# Patient Record
Sex: Male | Born: 1950 | Race: White | Hispanic: No | Marital: Married | Smoking: Former smoker
Health system: Southern US, Community
[De-identification: ages and names within clinical notes are randomized; demographics above are authoritative.]

## PROBLEM LIST (undated history)

## (undated) DIAGNOSIS — Z992 Dependence on renal dialysis: Secondary | ICD-10-CM

## (undated) DIAGNOSIS — N289 Disorder of kidney and ureter, unspecified: Secondary | ICD-10-CM

## (undated) DIAGNOSIS — D638 Anemia in other chronic diseases classified elsewhere: Secondary | ICD-10-CM

## (undated) DIAGNOSIS — D696 Thrombocytopenia, unspecified: Secondary | ICD-10-CM

## (undated) DIAGNOSIS — J9611 Chronic respiratory failure with hypoxia: Secondary | ICD-10-CM

## (undated) DIAGNOSIS — J449 Chronic obstructive pulmonary disease, unspecified: Secondary | ICD-10-CM

## (undated) DIAGNOSIS — E119 Type 2 diabetes mellitus without complications: Secondary | ICD-10-CM

## (undated) DIAGNOSIS — D649 Anemia, unspecified: Secondary | ICD-10-CM

## (undated) DIAGNOSIS — E213 Hyperparathyroidism, unspecified: Secondary | ICD-10-CM

## (undated) DIAGNOSIS — N186 End stage renal disease: Secondary | ICD-10-CM

## (undated) HISTORY — PX: CATARACT EXTRACTION W/ INTRAOCULAR LENS  IMPLANT, BILATERAL: SHX1307

## (undated) HISTORY — PX: BELOW KNEE LEG AMPUTATION: SUR23

## (undated) HISTORY — PX: CHOLECYSTECTOMY: SHX55

---

## 2015-07-25 ENCOUNTER — Encounter: Payer: Self-pay | Admitting: Emergency Medicine

## 2015-07-25 ENCOUNTER — Emergency Department
Admission: EM | Admit: 2015-07-25 | Discharge: 2015-07-25 | Disposition: A | Discharge status: Home / Self Care | Payer: Medicare Other | Attending: Emergency Medicine | Admitting: Emergency Medicine

## 2015-07-25 DIAGNOSIS — N186 End stage renal disease: Secondary | ICD-10-CM | POA: Insufficient documentation

## 2015-07-25 DIAGNOSIS — Z992 Dependence on renal dialysis: Secondary | ICD-10-CM | POA: Diagnosis not present

## 2015-07-25 DIAGNOSIS — Y838 Other surgical procedures as the cause of abnormal reaction of the patient, or of later complication, without mention of misadventure at the time of the procedure: Secondary | ICD-10-CM | POA: Insufficient documentation

## 2015-07-25 DIAGNOSIS — I97618 Postprocedural hemorrhage and hematoma of a circulatory system organ or structure following other circulatory system procedure: Secondary | ICD-10-CM | POA: Diagnosis present

## 2015-07-25 HISTORY — DX: Disorder of kidney and ureter, unspecified: N28.9

## 2015-07-25 HISTORY — DX: Type 2 diabetes mellitus without complications: E11.9

## 2015-07-25 NOTE — ED Notes (Signed)
Pt came to ed with c/o bleeding from vascath that was placed this AM

## 2015-07-25 NOTE — ED Provider Notes (Signed)
[facility] Emergency Department Provider Note  ____________________________________________  Time seen: Approximately [id number] PM  I have reviewed the triage vital signs and the nursing notes.   HISTORY  Chief Complaint No chief complaint on file.    HPI Frank Proctor is a 64 y.o. male with a history of end-stage renal disease on dialysis who presents today with bleeding from his newly placed permacath over the right chest. He said it was placed earlier today at the [facility] hospital. Then about half an hour prior to arrival he noticed a sticky sensation on his chest. Once he lifted up his shirt he saw that he was covered in blood. He is now having persistent oozing from the right-sided permacath. He is scheduled to be dialyzed tomorrow.Denies any jarring motion or trauma to the surgical site. Denies being on blood thinners except for the heparin given to him at dialysis.   No past medical history on file.  There are no active problems to display for this patient.   No past surgical history on file.  No current outpatient prescriptions on file.  Allergies Review of patient's allergies indicates not on file.  No family history on file.  Social History Social History  Substance Use Topics  . Smoking status: Not on file  . Smokeless tobacco: Not on file  . Alcohol Use: Not on file    Review of Systems Constitutional: No fever/chills Eyes: No visual changes. ENT: No sore throat. Cardiovascular: Denies chest pain. Respiratory: Denies shortness of breath. Gastrointestinal: No abdominal pain.  No nausea, no vomiting.  No diarrhea.  No constipation. Genitourinary: Negative for dysuria. Musculoskeletal: Negative for back pain. Skin: Negative for rash. Neurological: Negative for headaches, focal weakness or numbness.  10-point ROS otherwise negative.  ____________________________________________   PHYSICAL EXAM:  VITAL SIGNS: ED Triage Vitals  Enc  Vitals Group     BP --      Pulse --      Resp --      Temp --      Temp src --      SpO2 --      Weight --      Height --      Head Cir --      Peak Flow --      Pain Score --      Pain Loc --      Pain Edu? --      Excl. in [place]? --     Constitutional: Alert and oriented. Well appearing and in no acute distress. Eyes: Conjunctivae are normal. PERRL. EOMI. Head: Atraumatic. Nose: No congestion/rhinnorhea. Mouth/Throat: Mucous membranes are moist.  Oropharynx non-erythematous. Neck: No stridor.   Cardiovascular: Normal rate, regular rhythm. Grossly normal heart sounds.  Good peripheral circulation. Respiratory: Normal respiratory effort.  No retractions. Lungs CTAB. Gastrointestinal: Soft and nontender. No distention. No abdominal bruits. No CVA tenderness. Musculoskeletal: No lower extremity tenderness nor edema.  No joint effusions. Right-sided chest permacath with oozing blood from the insertion site. No pulsatile bleeding. No hematoma or swelling along the track of the permacath. Neurologic:  Normal speech and language. No gross focal neurologic deficits are appreciated. No gait instability. Skin:  Skin is warm, dry and intact. No rash noted. Psychiatric: Mood and affect are normal. Speech and behavior are normal.  ____________________________________________   LABS (all labs ordered are listed, but only abnormal results are displayed)  Labs Reviewed - No data to display ____________________________________________  EKG   ____________________________________________  RADIOLOGY  ____________________________________________   PROCEDURES  Removed Tegaderm overlying the permacath and cleared clots away from the entry site on the right side of the chest with sterile 4 x 4's soaked in sterile saline. Surgicel 2 wrapped around the insertion site of the catheter. Pressure held for 10 minutes and bleeding now stopped. Will cover with sterile 2 x 2's and  Tegaderm.   ____________________________________________   INITIAL IMPRESSION / ASSESSMENT AND PLAN / ED COURSE  Pertinent labs & imaging results that were available during my care of the patient were reviewed by me and considered in my medical decision making (see chart for details). ----------------------------------------- 10:21 PM on 07/25/2015 -----------------------------------------  No further bleeding. Patient will report to dialysis first thing tomorrow morning. To sleep upright tonight. Also to put ice over the site of the insertion of the catheter. knows to first try to hold pressure for 15-20 minutes if bleeding restarts. Will report to the emergency department if concerned about bleeding. Patient and wife understand the plan and are willing to comply. ____________________________________________   FINAL CLINICAL IMPRESSION(S) / ED DIAGNOSES  Postoperative bleeding. Initial visit.    Orbie Pyo, MD 07/25/15 2223

## 2016-07-09 ENCOUNTER — Other Ambulatory Visit
Admission: RE | Admit: 2016-07-09 | Discharge: 2016-07-09 | Disposition: A | Discharge status: Home / Self Care | Payer: Medicare Other | Source: Other Acute Inpatient Hospital | Attending: Nephrology | Admitting: Nephrology

## 2016-07-09 DIAGNOSIS — N186 End stage renal disease: Secondary | ICD-10-CM | POA: Insufficient documentation

## 2016-07-09 LAB — POTASSIUM: POTASSIUM: 6.1 mmol/L — AB (ref 3.5–5.1)

## 2016-10-19 ENCOUNTER — Emergency Department: Payer: Medicare Other

## 2016-10-19 ENCOUNTER — Encounter: Payer: Self-pay | Admitting: Emergency Medicine

## 2016-10-19 ENCOUNTER — Emergency Department
Admission: EM | Admit: 2016-10-19 | Discharge: 2016-10-19 | Disposition: A | Discharge status: Home / Self Care | Payer: Medicare Other | Attending: Emergency Medicine | Admitting: Emergency Medicine

## 2016-10-19 DIAGNOSIS — E119 Type 2 diabetes mellitus without complications: Secondary | ICD-10-CM | POA: Insufficient documentation

## 2016-10-19 DIAGNOSIS — Z87891 Personal history of nicotine dependence: Secondary | ICD-10-CM | POA: Diagnosis not present

## 2016-10-19 DIAGNOSIS — R1010 Upper abdominal pain, unspecified: Secondary | ICD-10-CM | POA: Diagnosis not present

## 2016-10-19 HISTORY — DX: Hyperparathyroidism, unspecified: E21.3

## 2016-10-19 HISTORY — DX: Anemia, unspecified: D64.9

## 2016-10-19 LAB — CBC WITH DIFFERENTIAL/PLATELET
BASOS PCT: 1 %
Basophils Absolute: 0.1 10*3/uL (ref 0–0.1)
Eosinophils Absolute: 0.4 10*3/uL (ref 0–0.7)
Eosinophils Relative: 6 %
HEMATOCRIT: 37.3 % — AB (ref 40.0–52.0)
Hemoglobin: 12.6 g/dL — ABNORMAL LOW (ref 13.0–18.0)
Lymphocytes Relative: 16 %
Lymphs Abs: 1.1 10*3/uL (ref 1.0–3.6)
MCH: 33 pg (ref 26.0–34.0)
MCHC: 33.7 g/dL (ref 32.0–36.0)
MCV: 97.7 fL (ref 80.0–100.0)
MONO ABS: 0.7 10*3/uL (ref 0.2–1.0)
MONOS PCT: 10 %
NEUTROS ABS: 4.6 10*3/uL (ref 1.4–6.5)
Neutrophils Relative %: 67 %
Platelets: 174 10*3/uL (ref 150–440)
RBC: 3.82 MIL/uL — ABNORMAL LOW (ref 4.40–5.90)
RDW: 15.3 % — AB (ref 11.5–14.5)
WBC: 6.9 10*3/uL (ref 3.8–10.6)

## 2016-10-19 LAB — COMPREHENSIVE METABOLIC PANEL
ALK PHOS: 130 U/L — AB (ref 38–126)
ALT: 16 U/L — AB (ref 17–63)
AST: 20 U/L (ref 15–41)
Albumin: 3.9 g/dL (ref 3.5–5.0)
Anion gap: 9 (ref 5–15)
BILIRUBIN TOTAL: 0.5 mg/dL (ref 0.3–1.2)
BUN: 23 mg/dL — AB (ref 6–20)
CALCIUM: 9.2 mg/dL (ref 8.9–10.3)
CHLORIDE: 100 mmol/L — AB (ref 101–111)
CO2: 32 mmol/L (ref 22–32)
CREATININE: 3.09 mg/dL — AB (ref 0.61–1.24)
GFR calc Af Amer: 23 mL/min — ABNORMAL LOW (ref >60)
GFR, EST NON AFRICAN AMERICAN: 20 mL/min — AB (ref >60)
Glucose, Bld: 80 mg/dL (ref 65–99)
Potassium: 3.9 mmol/L (ref 3.5–5.1)
Sodium: 141 mmol/L (ref 135–145)
TOTAL PROTEIN: 7.1 g/dL (ref 6.5–8.1)

## 2016-10-19 LAB — PROTIME-INR
INR: 2.21
Prothrombin Time: 24.9 seconds — ABNORMAL HIGH (ref 11.4–15.2)

## 2016-10-19 LAB — TROPONIN I: Troponin I: <0.03 ng/mL (ref <0.03)

## 2016-10-19 LAB — LIPASE, BLOOD: Lipase: 28 U/L (ref 11–51)

## 2016-10-19 MED ORDER — GI COCKTAIL ~~LOC~~
ORAL | Status: AC
  Filled 2016-10-19: qty 30

## 2016-10-19 MED ORDER — GI COCKTAIL ~~LOC~~
30.0000 mL | Freq: Once | ORAL | Status: AC
  Administered 2016-10-19: 30 mL via ORAL
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
## 2016-10-19 NOTE — Discharge Instructions (Signed)
If the pain comes back he can try a dose of Maalox or Mylanta. If that does not fix the pain within 5 or 10 minutes I would return here. Please return here for any other symptoms as well. Please follow-up with your gastroenterologist as we discussed.

## 2016-10-19 NOTE — ED Notes (Signed)
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
## 2016-10-19 NOTE — ED Notes (Addendum)
Pt has working fistula/graft in both upper arms with thrill present. He reports BP taken in LLE and IV can go in right hand/wrist. Pt is alert and oriented.

## 2016-10-29 ENCOUNTER — Other Ambulatory Visit
Admission: RE | Admit: 2016-10-29 | Discharge: 2016-10-29 | Disposition: A | Discharge status: Home / Self Care | Payer: Medicare Other | Source: Ambulatory Visit | Attending: Nephrology | Admitting: Nephrology

## 2016-10-29 DIAGNOSIS — E1022 Type 1 diabetes mellitus with diabetic chronic kidney disease: Secondary | ICD-10-CM | POA: Insufficient documentation

## 2016-10-29 DIAGNOSIS — N186 End stage renal disease: Secondary | ICD-10-CM | POA: Insufficient documentation

## 2016-10-29 LAB — POTASSIUM: Potassium: 5.5 mmol/L — ABNORMAL HIGH (ref 3.5–5.1)

## 2017-12-02 ENCOUNTER — Other Ambulatory Visit
Admission: RE | Admit: 2017-12-02 | Discharge: 2017-12-02 | Disposition: A | Discharge status: Home / Self Care | Payer: Medicare Other | Source: Other Acute Inpatient Hospital | Attending: Nephrology | Admitting: Nephrology

## 2017-12-02 ENCOUNTER — Inpatient Hospital Stay: Admit: 2017-12-02 | Payer: Self-pay

## 2017-12-02 DIAGNOSIS — N186 End stage renal disease: Secondary | ICD-10-CM | POA: Diagnosis present

## 2017-12-02 LAB — POTASSIUM: POTASSIUM: 5.4 mmol/L — AB (ref 3.5–5.1)

## 2019-02-15 ENCOUNTER — Other Ambulatory Visit: Payer: Self-pay

## 2019-02-15 ENCOUNTER — Emergency Department: Payer: Medicare Other

## 2019-02-15 ENCOUNTER — Emergency Department
Admission: EM | Admit: 2019-02-15 | Discharge: 2019-02-15 | Disposition: A | Discharge status: Home / Self Care | Payer: Medicare Other | Attending: Emergency Medicine | Admitting: Emergency Medicine

## 2019-02-15 DIAGNOSIS — E119 Type 2 diabetes mellitus without complications: Secondary | ICD-10-CM | POA: Insufficient documentation

## 2019-02-15 DIAGNOSIS — Y92410 Unspecified street and highway as the place of occurrence of the external cause: Secondary | ICD-10-CM | POA: Diagnosis not present

## 2019-02-15 DIAGNOSIS — Y9389 Activity, other specified: Secondary | ICD-10-CM | POA: Diagnosis not present

## 2019-02-15 DIAGNOSIS — Z7901 Long term (current) use of anticoagulants: Secondary | ICD-10-CM | POA: Insufficient documentation

## 2019-02-15 DIAGNOSIS — S199XXA Unspecified injury of neck, initial encounter: Secondary | ICD-10-CM | POA: Diagnosis present

## 2019-02-15 DIAGNOSIS — S161XXA Strain of muscle, fascia and tendon at neck level, initial encounter: Secondary | ICD-10-CM

## 2019-02-15 DIAGNOSIS — R51 Headache: Secondary | ICD-10-CM | POA: Diagnosis not present

## 2019-02-15 DIAGNOSIS — Z87891 Personal history of nicotine dependence: Secondary | ICD-10-CM | POA: Diagnosis not present

## 2019-02-15 DIAGNOSIS — Y998 Other external cause status: Secondary | ICD-10-CM | POA: Insufficient documentation

## 2019-02-15 MED ORDER — ACETAMINOPHEN 325 MG PO TABS
650.0000 mg | ORAL_TABLET | Freq: Once | ORAL | Status: AC
  Administered 2019-02-15: 650 mg via ORAL
  Filled 2019-02-15: qty 2
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
## 2019-02-15 NOTE — ED Triage Notes (Signed)
C/o Of neck pain

## 2019-02-15 NOTE — Discharge Instructions (Addendum)
Your exam and CT scans are essentially normal at this time. Take Tylenol as needed for pain relief. Follow-up with your provider or return as needed.
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
## 2020-01-11 ENCOUNTER — Emergency Department
Admission: EM | Admit: 2020-01-11 | Discharge: 2020-01-11 | Disposition: A | Discharge status: Home / Self Care | Payer: No Typology Code available for payment source | Attending: Student in an Organized Health Care Education/Training Program | Admitting: Student in an Organized Health Care Education/Training Program

## 2020-01-11 ENCOUNTER — Emergency Department: Payer: No Typology Code available for payment source

## 2020-01-11 ENCOUNTER — Encounter: Payer: Self-pay | Admitting: Emergency Medicine

## 2020-01-11 ENCOUNTER — Other Ambulatory Visit: Payer: Self-pay

## 2020-01-11 DIAGNOSIS — Z87891 Personal history of nicotine dependence: Secondary | ICD-10-CM | POA: Insufficient documentation

## 2020-01-11 DIAGNOSIS — Y92538 Other ambulatory health services establishments as the place of occurrence of the external cause: Secondary | ICD-10-CM | POA: Diagnosis not present

## 2020-01-11 DIAGNOSIS — W19XXXA Unspecified fall, initial encounter: Secondary | ICD-10-CM

## 2020-01-11 DIAGNOSIS — M545 Low back pain: Secondary | ICD-10-CM | POA: Diagnosis not present

## 2020-01-11 DIAGNOSIS — M25561 Pain in right knee: Secondary | ICD-10-CM | POA: Insufficient documentation

## 2020-01-11 DIAGNOSIS — S0990XA Unspecified injury of head, initial encounter: Secondary | ICD-10-CM | POA: Insufficient documentation

## 2020-01-11 DIAGNOSIS — A09 Infectious gastroenteritis and colitis, unspecified: Secondary | ICD-10-CM | POA: Diagnosis not present

## 2020-01-11 DIAGNOSIS — Z992 Dependence on renal dialysis: Secondary | ICD-10-CM | POA: Insufficient documentation

## 2020-01-11 DIAGNOSIS — W010XXA Fall on same level from slipping, tripping and stumbling without subsequent striking against object, initial encounter: Secondary | ICD-10-CM | POA: Insufficient documentation

## 2020-01-11 DIAGNOSIS — Y999 Unspecified external cause status: Secondary | ICD-10-CM | POA: Insufficient documentation

## 2020-01-11 DIAGNOSIS — Y939 Activity, unspecified: Secondary | ICD-10-CM | POA: Diagnosis not present

## 2020-01-11 DIAGNOSIS — N289 Disorder of kidney and ureter, unspecified: Secondary | ICD-10-CM | POA: Diagnosis not present

## 2020-01-11 DIAGNOSIS — R079 Chest pain, unspecified: Secondary | ICD-10-CM | POA: Diagnosis present

## 2020-01-11 LAB — CBC
HCT: 31.8 % — ABNORMAL LOW (ref 39.0–52.0)
Hemoglobin: 10.1 g/dL — ABNORMAL LOW (ref 13.0–17.0)
MCH: 30.5 pg (ref 26.0–34.0)
MCHC: 31.8 g/dL (ref 30.0–36.0)
MCV: 96.1 fL (ref 80.0–100.0)
Platelets: 239 10*3/uL (ref 150–400)
RBC: 3.31 MIL/uL — ABNORMAL LOW (ref 4.22–5.81)
RDW: 15.3 % (ref 11.5–15.5)
WBC: 16.7 10*3/uL — ABNORMAL HIGH (ref 4.0–10.5)
nRBC: 0 % (ref 0.0–0.2)

## 2020-01-11 LAB — URINALYSIS, COMPLETE (UACMP) WITH MICROSCOPIC
Bilirubin Urine: NEGATIVE
Glucose, UA: NEGATIVE mg/dL
Hgb urine dipstick: NEGATIVE
Ketones, ur: NEGATIVE mg/dL
Leukocytes,Ua: NEGATIVE
Nitrite: NEGATIVE
Protein, ur: 100 mg/dL — AB
Specific Gravity, Urine: 1.018 (ref 1.005–1.030)
pH: 5 (ref 5.0–8.0)

## 2020-01-11 LAB — BASIC METABOLIC PANEL
Anion gap: 17 — ABNORMAL HIGH (ref 5–15)
BUN: 57 mg/dL — ABNORMAL HIGH (ref 8–23)
CO2: 24 mmol/L (ref 22–32)
Calcium: 9.1 mg/dL (ref 8.9–10.3)
Chloride: 99 mmol/L (ref 98–111)
Creatinine, Ser: 5.52 mg/dL — ABNORMAL HIGH (ref 0.61–1.24)
GFR calc Af Amer: 11 mL/min — ABNORMAL LOW (ref >60)
GFR calc non Af Amer: 10 mL/min — ABNORMAL LOW (ref >60)
Glucose, Bld: 80 mg/dL (ref 70–99)
Potassium: 4 mmol/L (ref 3.5–5.1)
Sodium: 140 mmol/L (ref 135–145)

## 2020-01-11 NOTE — ED Notes (Signed)
Per patient's wife, patient was started on abx for cellulitis of his legs several weeks ago. States any time he uses abx he gets diarrhea. Was seen at [facility] ED over the weekend and started on oral Vancomycin for CDiff. Patient is on day 2 of abx and continues to have diarrhea. MD made aware.

## 2020-01-11 NOTE — ED Notes (Signed)
Patient able to ambulate to restroom with use of walker. Given soda to drink. States he will be able to provide urine sample after drinking. MD made aware.
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
## 2020-01-11 NOTE — ED Provider Notes (Signed)
[facility] Emergency Department Provider Note    First MD Initiated Contact with Patient 01/11/20 [phone]     (approximate)  I have reviewed the triage vital signs and the nursing notes.   HISTORY  Chief Complaint Fall    HPI Frank Proctor is a 69 y.o. male on dialysis presents to the ER for evaluation of left-sided chest wall pain right leg pain and head injury after mechanical fall at dialysis center today.  States he was getting up to use restroom felt like he dragged his right leg and tripped.  Landing hitting his head.  Is on Plavix.  Denies any pressure or shortness of breath.  No nausea or vomiting or diarrhea.  Did not have dialysis today.    Past Medical History:  Diagnosis Date  . Anemia   . Diabetes mellitus without complication ([place])   . Hyperparathyroidism ([place])   . Renal disorder    Family History  Family history unknown: Yes   Past Surgical History:  Procedure Laterality Date  . BELOW KNEE LEG AMPUTATION    . CATARACT EXTRACTION W/ INTRAOCULAR LENS  IMPLANT, BILATERAL    . CHOLECYSTECTOMY     There are no problems to display for this patient.     Prior to Admission medications   Not on File    Allergies Other    Social History Social History   Tobacco Use  . Smoking status: Former Smoker  Substance Use Topics  . Alcohol use: No  . Drug use: No    Review of Systems Patient denies headaches, rhinorrhea, blurry vision, numbness, shortness of breath, chest pain, edema, cough, abdominal pain, nausea, vomiting, diarrhea, dysuria, fevers, rashes or hallucinations unless otherwise stated above in HPI. ____________________________________________   PHYSICAL EXAM:  VITAL SIGNS: Vitals:   01/11/20 1039 01/11/20 1045  BP: 112/65   Pulse: 68 69  Resp: 13 11  Temp:    SpO2: 99% 98%    Constitutional: Alert and oriented.  Eyes: Conjunctivae are normal.  Head: Atraumatic. Nose: No  congestion/rhinnorhea. Mouth/Throat: Mucous membranes are moist.   Neck: No stridor. Painless ROM.  Cardiovascular: Normal rate, regular rhythm. Grossly normal heart sounds.  Good peripheral circulation. Respiratory: Normal respiratory effort.  No retractions. Lungs CTAB. Gastrointestinal: Soft and nontender. No distention. No abdominal bruits. No CVA tenderness. Genitourinary:  Musculoskeletal: ttp of right thigh,  Compartment soft,  Right prosthesis present.  NV intact lle.  No joint effusions. Neurologic:  Normal speech and language. No gross focal neurologic deficits are appreciated. No facial droop Skin:  Skin is warm, dry and intact. No rash noted. Psychiatric: Mood and affect are normal. Speech and behavior are normal.  ____________________________________________   LABS (all labs ordered are listed, but only abnormal results are displayed)  Results for orders placed or performed during the hospital encounter of 01/11/20 (from the past 24 hour(s))  Basic metabolic panel     Status: Abnormal   Collection Time: 01/11/20  7:11 AM  Result Value Ref Range   Sodium 140 135 - 145 mmol/L   Potassium 4.0 3.5 - 5.1 mmol/L   Chloride 99 98 - 111 mmol/L   CO2 24 22 - 32 mmol/L   Glucose, Bld 80 70 - 99 mg/dL   BUN 57 (H) 8 - 23 mg/dL   Creatinine, Ser 5.52 (H) 0.61 - 1.24 mg/dL   Calcium 9.1 8.9 - 10.3 mg/dL   GFR calc non Af Amer 10 (L) >60 mL/min   GFR calc  Af Amer 11 (L) >60 mL/min   Anion gap 17 (H) 5 - 15  CBC     Status: Abnormal   Collection Time: 01/11/20  7:11 AM  Result Value Ref Range   WBC 16.7 (H) 4.0 - 10.5 K/uL   RBC 3.31 (L) 4.22 - 5.81 MIL/uL   Hemoglobin 10.1 (L) 13.0 - 17.0 g/dL   HCT 31.8 (L) 39.0 - 52.0 %   MCV 96.1 80.0 - 100.0 fL   MCH 30.5 26.0 - 34.0 pg   MCHC 31.8 30.0 - 36.0 g/dL   RDW 15.3 11.5 - 15.5 %   Platelets 239 150 - 400 K/uL   nRBC 0.0 0.0 - 0.2 %  Urinalysis, Complete w Microscopic     Status: Abnormal   Collection Time: 01/11/20 10:32 AM   Result Value Ref Range   Color, Urine YELLOW (A) YELLOW   APPearance HAZY (A) CLEAR   Specific Gravity, Urine 1.018 1.005 - 1.030   pH 5.0 5.0 - 8.0   Glucose, UA NEGATIVE NEGATIVE mg/dL   Hgb urine dipstick NEGATIVE NEGATIVE   Bilirubin Urine NEGATIVE NEGATIVE   Ketones, ur NEGATIVE NEGATIVE mg/dL   Protein, ur 100 (A) NEGATIVE mg/dL   Nitrite NEGATIVE NEGATIVE   Leukocytes,Ua NEGATIVE NEGATIVE   RBC / HPF 0-5 0 - 5 RBC/hpf   WBC, UA 0-5 0 - 5 WBC/hpf   Bacteria, UA RARE (A) NONE SEEN   Squamous Epithelial / LPF 0-5 0 - 5   ____________________________________________  EKG My review and personal interpretation at Time: 7:20   Indication: fall  Rate: 70  Rhythm: sinus Axis: normal Other: normal intervals, poor r wave progression ____________________________________________  RADIOLOGY  I personally reviewed all radiographic images ordered to evaluate for the above acute complaints and reviewed radiology reports and findings.  These findings were personally discussed with the patient.  Please see medical record for radiology report.  ____________________________________________   PROCEDURES  Procedure(s) performed:  Procedures    Critical Care performed: no ____________________________________________   INITIAL IMPRESSION / ASSESSMENT AND PLAN / ED COURSE  Pertinent labs & imaging results that were available during my care of the patient were reviewed by me and considered in my medical decision making (see chart for details).   DDX: electrolye abn, anemia, sdh,iph, fracture, contusion  Frank Proctor is a 69 y.o. who presents to the ED with symptoms as described above.  Is afebrile hemodynamically stable.  Imaging blood recent for the above differential.  Patient with somewhat of a complex presentation given his extensive past medical history and comorbidities.  The patient will be placed on continuous pulse oximetry and telemetry for monitoring.  Laboratory  evaluation will be sent to evaluate for the above complaints.  Clinical Course as of Jan 10 1137  Wed Jan 11, 2020  0852 Patient reassessed.  Examined his right lower extremity no evidence of cellulitis.  Appears well-perfused distally.  No evidence of fracture.  CT imaging is reassuring.  Does have mild white count.  States he does still make urine therefore will check urinalysis.  No evidence of laboratory evidence suggesting need for emergent dialysis.   [PR]  1010 Patient able to ambulate with assistive device and feels back to his baseline.   [PR]  34 Wife does report the patient was recently started on vancomycin for C. difficile seen at the [place] now currently on day 2 of antibiotics that would partially explain his white count.  Will order imaging to exclude megacolon.   [  PR]  1122 CT imaging does not show any evidence of toxic megacolon or obstructive pattern.  Consistent with known diagnosis of colitis.  Repeat abdominal exam soft and benign.  At this point do believe he stable and appropriate for discharge for routine dialysis.   [PR]    Clinical Course User Index [PR] Merlyn Lot, MD    The patient was evaluated in Emergency Department today for the symptoms described in the history of present illness. He/she was evaluated in the context of the global COVID-19 pandemic, which necessitated consideration that the patient might be at risk for infection with the SARS-CoV-2 virus that causes COVID-19. Institutional protocols and algorithms that pertain to the evaluation of patients at risk for COVID-19 are in a state of rapid change based on information released by regulatory bodies including the CDC and federal and state organizations. These policies and algorithms were followed during the patient's care in the ED.  As part of my medical decision making, I reviewed the following data within the [facility] notes reviewed and incorporated, Labs reviewed, notes  from prior ED visits and [place] Controlled Substance Database   ____________________________________________   FINAL CLINICAL IMPRESSION(S) / ED DIAGNOSES  Final diagnoses:  Acute pain of right knee  Fall, initial encounter  Infectious diarrhea in adult patient      NEW MEDICATIONS STARTED DURING THIS VISIT:  New Prescriptions   No medications on file     Note:  This document was prepared using Dragon voice recognition software and may include unintentional dictation errors.    Merlyn Lot, MD 01/11/20 [phone]

## 2020-01-11 NOTE — Discharge Instructions (Addendum)
Please follow-up with PCP.  Take your antibiotics as prescribed.  Fortunately your imaging and work-up here in the ER has been reassuring.  Please return if you have any additional questions or concerns.

## 2020-07-02 ENCOUNTER — Emergency Department: Payer: No Typology Code available for payment source

## 2020-07-02 ENCOUNTER — Encounter: Payer: Self-pay | Admitting: Emergency Medicine

## 2020-07-02 ENCOUNTER — Emergency Department
Admission: EM | Admit: 2020-07-02 | Discharge: 2020-07-02 | Disposition: A | Discharge status: Home / Self Care | Payer: No Typology Code available for payment source | Attending: Emergency Medicine | Admitting: Emergency Medicine

## 2020-07-02 ENCOUNTER — Other Ambulatory Visit: Payer: Self-pay

## 2020-07-02 DIAGNOSIS — N186 End stage renal disease: Secondary | ICD-10-CM | POA: Insufficient documentation

## 2020-07-02 DIAGNOSIS — R079 Chest pain, unspecified: Secondary | ICD-10-CM | POA: Diagnosis not present

## 2020-07-02 DIAGNOSIS — R0602 Shortness of breath: Secondary | ICD-10-CM | POA: Diagnosis not present

## 2020-07-02 DIAGNOSIS — Z20822 Contact with and (suspected) exposure to covid-19: Secondary | ICD-10-CM | POA: Insufficient documentation

## 2020-07-02 DIAGNOSIS — R7989 Other specified abnormal findings of blood chemistry: Secondary | ICD-10-CM

## 2020-07-02 DIAGNOSIS — E109 Type 1 diabetes mellitus without complications: Secondary | ICD-10-CM | POA: Diagnosis not present

## 2020-07-02 DIAGNOSIS — R05 Cough: Secondary | ICD-10-CM | POA: Insufficient documentation

## 2020-07-02 DIAGNOSIS — E875 Hyperkalemia: Secondary | ICD-10-CM | POA: Insufficient documentation

## 2020-07-02 DIAGNOSIS — J449 Chronic obstructive pulmonary disease, unspecified: Secondary | ICD-10-CM | POA: Diagnosis not present

## 2020-07-02 LAB — TROPONIN I (HIGH SENSITIVITY)
Troponin I (High Sensitivity): 37 ng/L — ABNORMAL HIGH (ref <18)
Troponin I (High Sensitivity): 37 ng/L — ABNORMAL HIGH (ref <18)
Troponin I (High Sensitivity): 37 ng/L — ABNORMAL HIGH (ref <18)

## 2020-07-02 LAB — BASIC METABOLIC PANEL
Anion gap: 11 (ref 5–15)
BUN: 46 mg/dL — ABNORMAL HIGH (ref 8–23)
CO2: 31 mmol/L (ref 22–32)
Calcium: 9.3 mg/dL (ref 8.9–10.3)
Chloride: 96 mmol/L — ABNORMAL LOW (ref 98–111)
Creatinine, Ser: 4.88 mg/dL — ABNORMAL HIGH (ref 0.61–1.24)
GFR calc Af Amer: 13 mL/min — ABNORMAL LOW (ref >60)
GFR calc non Af Amer: 11 mL/min — ABNORMAL LOW (ref >60)
Glucose, Bld: 160 mg/dL — ABNORMAL HIGH (ref 70–99)
Potassium: 5.6 mmol/L — ABNORMAL HIGH (ref 3.5–5.1)
Sodium: 138 mmol/L (ref 135–145)

## 2020-07-02 LAB — CBC
HCT: 34.3 % — ABNORMAL LOW (ref 39.0–52.0)
Hemoglobin: 11.3 g/dL — ABNORMAL LOW (ref 13.0–17.0)
MCH: 31.5 pg (ref 26.0–34.0)
MCHC: 32.9 g/dL (ref 30.0–36.0)
MCV: 95.5 fL (ref 80.0–100.0)
Platelets: 132 10*3/uL — ABNORMAL LOW (ref 150–400)
RBC: 3.59 MIL/uL — ABNORMAL LOW (ref 4.22–5.81)
RDW: 18.1 % — ABNORMAL HIGH (ref 11.5–15.5)
WBC: 10 10*3/uL (ref 4.0–10.5)
nRBC: 0 % (ref 0.0–0.2)

## 2020-07-02 LAB — BRAIN NATRIURETIC PEPTIDE: B Natriuretic Peptide: >4500 pg/mL — ABNORMAL HIGH (ref 0.0–100.0)

## 2020-07-02 LAB — MAGNESIUM: Magnesium: 2.4 mg/dL (ref 1.7–2.4)

## 2020-07-02 LAB — SARS CORONAVIRUS 2 BY RT PCR (HOSPITAL ORDER, PERFORMED IN ~~LOC~~ HOSPITAL LAB): SARS Coronavirus 2: NEGATIVE

## 2020-07-02 NOTE — TOC Initial Note (Addendum)
Transition of Care [facility]) - Initial/Assessment Note    Patient Details  Name: Frank Proctor MRN: [id number] Date of Birth: February 05, 1951  [facility] CM/SW Contact:    Anselm Pancoast, RN Phone Number: 07/02/2020, 3:40 PM  Clinical Narrative:                 Spoke to EDP who requested assistance with arranging discharge to dialysis. RN CM spoke with dialysis navigator who confirmed patient could return to [place] this afternoon and complete dialysis session. Patient is a bilateral BKA and left wheelchair at dialysis center. Patient is resident of [facility] and [place] was able to pick up wheelchair. [facility] can not provide transportation to dialysis and [place] is not available to assist with transportation.   RN CM outreached to [employer] who confirmed availability for transport however patient would be responsible for bill due to [place] as payer. [facility] is unable to provide timeframe for patient to be picked up and returned to Dialysis center leaving no other option other than [facility] in order for patient to get to Dialysis on time.   LVMM for spouse, Margaretha Sheffield, requesting callback to discuss transportation concerns.   Confirmed with Winnifred Friar @ [facility]-patient would return to Dialysis today prior to returning to [facility].   [facility] unable to transport patient and accomodate dialysis need timeframe. RN CM confirmed with Beverely Low @ [facility] transportation for 415pm today to [facility] on [address].   Patient will need EMS for transport back to [facility] after dialysis. Dialysis Navigator updated and states she will contact [facility] staff and make them aware of need for EMS.         Patient Goals and CMS Choice        Expected Discharge Plan and Services                                                Prior Living Arrangements/Services                       Activities of Daily Living      Permission Sought/Granted                   Emotional Assessment              Admission diagnosis:  Chest pain, EMS There are no problems to display for this patient.  PCP:  Center, [place]:  No Pharmacies Listed    Social Determinants of Health (SDOH) Interventions    Readmission Risk Interventions No flowsheet data found.

## 2020-07-02 NOTE — ED Triage Notes (Signed)
Pt reports was receiving dialysis and started having CP that was heavy in nature and SOB.

## 2020-07-02 NOTE — ED Provider Notes (Signed)
[facility] Emergency Department Provider Note  ____________________________________________   First MD Initiated Contact with Patient 07/02/20 1133     (approximate)  I have reviewed the triage vital signs and the nursing notes.   HISTORY  Chief Complaint Chest Pain and Shortness of Breath   HPI Frank Proctor is a 69 y.o. 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  Patient endorses a chronic cough but denies any other acute symptoms including fevers, vomiting, diarrhea, dysuria, rash, recent falls or injuries, abdominal pain, or other similar episodes.  Denies clear alleviating or aggravating factors.         History reviewed. No pertinent past medical history.  There are no problems to display for this patient.   History reviewed. No pertinent surgical history.  Prior to Admission medications   Not on File    Allergies Patient has no allergy information on record.  No family history on file.  Social History Social History   Tobacco Use  . Smoking status: Not on file  Substance Use Topics  . Alcohol use: Not on file  . Drug use: Not on file    Review of Systems  Review of Systems  Constitutional: Negative for chills and fever.  HENT: Negative for sore throat.   Eyes: Negative for pain.  Respiratory: Positive for cough ( chronic). Negative for stridor.   Cardiovascular: Positive for chest pain.  Gastrointestinal: Negative for vomiting.  Skin: Negative for rash.  Neurological: Negative for seizures, loss of consciousness and headaches.  Psychiatric/Behavioral: Negative for suicidal ideas.  All other systems reviewed and are negative.      ____________________________________________   PHYSICAL EXAM:  VITAL SIGNS: ED Triage Vitals  Enc Vitals Group     BP 07/02/20 0932 (!) 126/63     Pulse Rate 07/02/20 0932 71     Resp 07/02/20 0932 20     Temp 07/02/20 0932 98.1 F (36.7 C)     Temp Source 07/02/20 0932 Oral     SpO2 07/02/20 0932 (!) 85 %     Weight 07/02/20 0933 140 lb (63.5 kg)     Height --      Head Circumference --      Peak Flow --      Pain Score 07/02/20 0933 5     Pain Loc --      Pain Edu? --      Excl. in [place]? --    Vitals:   07/02/20 0950 07/02/20 1145  BP:  123/67  Pulse: 65 65  Resp:  16  Temp:    SpO2: 99% 94%   Physical Exam Vitals and nursing note reviewed.  Constitutional:      Appearance: He is well-developed.  HENT:     Head: Normocephalic and atraumatic.     Right Ear: External ear normal.     Left Ear: External ear normal.     Nose: Nose normal.     Mouth/Throat:     Mouth: Mucous membranes are moist.  Eyes:     Conjunctiva/sclera: Conjunctivae normal.  Cardiovascular:     Rate and Rhythm: Normal rate and regular rhythm.     Heart sounds: No murmur heard.   Pulmonary:  Effort: Pulmonary effort is normal. No respiratory distress.     Breath sounds: Normal breath sounds.  Abdominal:     Palpations: Abdomen is soft.     Tenderness: There is no abdominal tenderness.  Musculoskeletal:     Cervical back: Neck supple.     Right lower leg: No edema.     Left lower leg: No edema.     Right Lower Extremity: Right leg is amputated below knee.     Left Lower Extremity: Left leg is amputated below knee.  Skin:    General: Skin is warm and dry.  Neurological:     General: No focal deficit present.     Mental Status: He is alert and oriented to person, place, and time.  Psychiatric:        Mood and Affect: Mood normal.      ____________________________________________   LABS (all labs ordered are listed, but only abnormal results are displayed)  Labs Reviewed   BASIC METABOLIC PANEL - Abnormal; Notable for the following components:      Result Value   Potassium 5.6 (*)    Chloride 96 (*)    Glucose, Bld 160 (*)    BUN 46 (*)    Creatinine, Ser 4.88 (*)    GFR calc non Af Amer 11 (*)    GFR calc Af Amer 13 (*)    All other components within normal limits  CBC - Abnormal; Notable for the following components:   RBC 3.59 (*)    Hemoglobin 11.3 (*)    HCT 34.3 (*)    RDW 18.1 (*)    Platelets 132 (*)    All other components within normal limits  BRAIN NATRIURETIC PEPTIDE - Abnormal; Notable for the following components:   B Natriuretic Peptide >4,500.0 (*)    All other components within normal limits  TROPONIN I (HIGH SENSITIVITY) - Abnormal; Notable for the following components:   Troponin I (High Sensitivity) 37 (*)    All other components within normal limits  TROPONIN I (HIGH SENSITIVITY) - Abnormal; Notable for the following components:   Troponin I (High Sensitivity) 37 (*)    All other components within normal limits  TROPONIN I (HIGH SENSITIVITY) - Abnormal; Notable for the following components:   Troponin I (High Sensitivity) 37 (*)    All other components within normal limits  SARS CORONAVIRUS 2 BY RT PCR (HOSPITAL ORDER, [facility])  MAGNESIUM  TROPONIN I (HIGH SENSITIVITY)   ____________________________________________  EKG  Normal sinus rhythm with normal axis, unremarkable intervals, and no evidence of acute ischemia or underlying arrhythmia. ____________________________________________  RADIOLOGY  Mild edema noted on chest x-ray.  No pneumothorax, focal consolidation, or significant effusion.  Official radiology report(s): DG Chest 2 View  Result Date: 07/02/2020 CLINICAL DATA:  Chest pain. EXAM: CHEST - 2 VIEW COMPARISON:  None. FINDINGS: Mild cardiomegaly is noted. No pneumothorax is noted. Mild bibasilar subsegmental atelectasis or edema is noted. Small left pleural effusion is noted.  Bony thorax is unremarkable. IMPRESSION: Mild bibasilar subsegmental atelectasis or edema is noted. Small left pleural effusion. Aortic Atherosclerosis (ICD10-I70.0). Electronically Signed   By: Marijo Conception M.D.   On: 07/02/2020 10:50    ____________________________________________   PROCEDURES  Procedure(s) performed (including Critical Care):  Procedures   ____________________________________________   INITIAL IMPRESSION / ASSESSMENT AND PLAN / ED COURSE        Overall patient's history, exam, and ED work-up is unrevealing for clear cause  for patient's chest pain although on multiple reassessments patient stated his pain had resolved and he felt back to baseline.  Patient is noted to have an SPO2 saturation of 98% on his home 2 L.  I have low suspicion for pneumonia as there is no focal consolidation on x-ray, change in patient's chronic cough, or significant ovation of his white blood cell count.  Low suspicion for ACS given multiple troponins at 37 that are shown stability over several hours with no acute ischemic changes on ECG.  While 37 is slightly above normal limits given no delta over several hours and history of ESRD have a low suspicion for ACS.  Presentation is not consistent with PE given no evidence of hypoxia or respiratory distress eyes evidence on exam of cellulitis or other obvious chest wall etiology.  In addition description of pain is not consistent with dissection and there is no widening of mediastinum or pulse deficit.  I discussed patient's presentation and labs with on-call nephrology Dr. Holley Raring who agreed patient would likely benefit from finishing his dialysis given his potassium was slightly elevated and he had only received less than half of his scheduled treatment.  Patient was discharged in stable condition with plan to follow-up at his dialysis clinic to finish the remainder of his dialysis session today.  Strict return precautions advised and  discussed.      Clinical Course as of Jul 02 1558  Mon Jul 02, 2020  1140 Potassium(!): 5.6 [ZS]    Clinical Course User Index [ZS] Lucrezia Starch, MD     ____________________________________________   FINAL CLINICAL IMPRESSION(S) / ED DIAGNOSES  Final diagnoses:  Chest pain, unspecified type  ESRD (end stage renal disease) ([place])  Hyperkalemia  Elevated brain natriuretic peptide (BNP) level     ED Discharge Orders    None       Note:  This document was prepared using Dragon voice recognition software and may include unintentional dictation errors.   Lucrezia Starch, MD 07/02/20 1600

## 2020-07-02 NOTE — ED Triage Notes (Signed)
Pt in via EMS from dialysis with c/o CP, non traumatic. Pt was in treatment for an hour when CP started. Denies radiation, does report SOB. #20g to left AC. Pt was given 324mg  of aspirin. FSBS 266

## 2020-09-02 ENCOUNTER — Inpatient Hospital Stay
Admission: EM | Admit: 2020-09-02 | Discharge: 2020-09-06 | DRG: 246 | Disposition: A | Discharge status: Rehabilitation Facility | Payer: No Typology Code available for payment source | Source: Skilled Nursing Facility | Attending: Internal Medicine | Admitting: Internal Medicine

## 2020-09-02 ENCOUNTER — Emergency Department: Payer: No Typology Code available for payment source

## 2020-09-02 DIAGNOSIS — D696 Thrombocytopenia, unspecified: Secondary | ICD-10-CM | POA: Diagnosis present

## 2020-09-02 DIAGNOSIS — Z888 Allergy status to other drugs, medicaments and biological substances status: Secondary | ICD-10-CM | POA: Diagnosis not present

## 2020-09-02 DIAGNOSIS — Z882 Allergy status to sulfonamides status: Secondary | ICD-10-CM | POA: Diagnosis not present

## 2020-09-02 DIAGNOSIS — I272 Pulmonary hypertension, unspecified: Secondary | ICD-10-CM | POA: Diagnosis present

## 2020-09-02 DIAGNOSIS — I50814 Right heart failure due to left heart failure: Secondary | ICD-10-CM | POA: Diagnosis present

## 2020-09-02 DIAGNOSIS — I132 Hypertensive heart and chronic kidney disease with heart failure and with stage 5 chronic kidney disease, or end stage renal disease: Secondary | ICD-10-CM | POA: Diagnosis present

## 2020-09-02 DIAGNOSIS — Z91014 Allergy to mammalian meats: Secondary | ICD-10-CM | POA: Diagnosis present

## 2020-09-02 DIAGNOSIS — Z89512 Acquired absence of left leg below knee: Secondary | ICD-10-CM | POA: Diagnosis not present

## 2020-09-02 DIAGNOSIS — J9611 Chronic respiratory failure with hypoxia: Secondary | ICD-10-CM | POA: Diagnosis present

## 2020-09-02 DIAGNOSIS — M19011 Primary osteoarthritis, right shoulder: Secondary | ICD-10-CM | POA: Diagnosis present

## 2020-09-02 DIAGNOSIS — L8990 Pressure ulcer of unspecified site, unspecified stage: Secondary | ICD-10-CM | POA: Insufficient documentation

## 2020-09-02 DIAGNOSIS — J189 Pneumonia, unspecified organism: Secondary | ICD-10-CM | POA: Diagnosis not present

## 2020-09-02 DIAGNOSIS — I214 Non-ST elevation (NSTEMI) myocardial infarction: Principal | ICD-10-CM

## 2020-09-02 DIAGNOSIS — Z23 Encounter for immunization: Secondary | ICD-10-CM | POA: Diagnosis not present

## 2020-09-02 DIAGNOSIS — N186 End stage renal disease: Secondary | ICD-10-CM | POA: Diagnosis present

## 2020-09-02 DIAGNOSIS — E1022 Type 1 diabetes mellitus with diabetic chronic kidney disease: Secondary | ICD-10-CM | POA: Diagnosis not present

## 2020-09-02 DIAGNOSIS — I361 Nonrheumatic tricuspid (valve) insufficiency: Secondary | ICD-10-CM | POA: Diagnosis not present

## 2020-09-02 DIAGNOSIS — I340 Nonrheumatic mitral (valve) insufficiency: Secondary | ICD-10-CM | POA: Diagnosis not present

## 2020-09-02 DIAGNOSIS — Z89511 Acquired absence of right leg below knee: Secondary | ICD-10-CM

## 2020-09-02 DIAGNOSIS — E1052 Type 1 diabetes mellitus with diabetic peripheral angiopathy with gangrene: Secondary | ICD-10-CM | POA: Diagnosis not present

## 2020-09-02 DIAGNOSIS — I255 Ischemic cardiomyopathy: Secondary | ICD-10-CM | POA: Diagnosis present

## 2020-09-02 DIAGNOSIS — I5021 Acute systolic (congestive) heart failure: Secondary | ICD-10-CM

## 2020-09-02 DIAGNOSIS — I2584 Coronary atherosclerosis due to calcified coronary lesion: Secondary | ICD-10-CM | POA: Diagnosis present

## 2020-09-02 DIAGNOSIS — I2489 Other forms of acute ischemic heart disease: Secondary | ICD-10-CM | POA: Diagnosis present

## 2020-09-02 DIAGNOSIS — E1069 Type 1 diabetes mellitus with other specified complication: Secondary | ICD-10-CM | POA: Diagnosis present

## 2020-09-02 DIAGNOSIS — Z66 Do not resuscitate: Secondary | ICD-10-CM | POA: Diagnosis present

## 2020-09-02 DIAGNOSIS — L89151 Pressure ulcer of sacral region, stage 1: Secondary | ICD-10-CM | POA: Diagnosis present

## 2020-09-02 DIAGNOSIS — Z7982 Long term (current) use of aspirin: Secondary | ICD-10-CM

## 2020-09-02 DIAGNOSIS — Z794 Long term (current) use of insulin: Secondary | ICD-10-CM | POA: Diagnosis not present

## 2020-09-02 DIAGNOSIS — M869 Osteomyelitis, unspecified: Secondary | ICD-10-CM | POA: Diagnosis present

## 2020-09-02 DIAGNOSIS — J432 Centrilobular emphysema: Secondary | ICD-10-CM | POA: Diagnosis not present

## 2020-09-02 DIAGNOSIS — Z9981 Dependence on supplemental oxygen: Secondary | ICD-10-CM

## 2020-09-02 DIAGNOSIS — Z79899 Other long term (current) drug therapy: Secondary | ICD-10-CM

## 2020-09-02 DIAGNOSIS — E785 Hyperlipidemia, unspecified: Secondary | ICD-10-CM

## 2020-09-02 DIAGNOSIS — E875 Hyperkalemia: Secondary | ICD-10-CM | POA: Diagnosis present

## 2020-09-02 DIAGNOSIS — R079 Chest pain, unspecified: Secondary | ICD-10-CM

## 2020-09-02 DIAGNOSIS — E213 Hyperparathyroidism, unspecified: Secondary | ICD-10-CM | POA: Diagnosis present

## 2020-09-02 DIAGNOSIS — I248 Other forms of acute ischemic heart disease: Secondary | ICD-10-CM | POA: Diagnosis not present

## 2020-09-02 DIAGNOSIS — Z87891 Personal history of nicotine dependence: Secondary | ICD-10-CM

## 2020-09-02 DIAGNOSIS — Z8701 Personal history of pneumonia (recurrent): Secondary | ICD-10-CM

## 2020-09-02 DIAGNOSIS — Z992 Dependence on renal dialysis: Secondary | ICD-10-CM

## 2020-09-02 DIAGNOSIS — Z20822 Contact with and (suspected) exposure to covid-19: Secondary | ICD-10-CM | POA: Diagnosis present

## 2020-09-02 DIAGNOSIS — M19012 Primary osteoarthritis, left shoulder: Secondary | ICD-10-CM | POA: Diagnosis present

## 2020-09-02 DIAGNOSIS — I10 Essential (primary) hypertension: Secondary | ICD-10-CM | POA: Diagnosis not present

## 2020-09-02 DIAGNOSIS — I200 Unstable angina: Secondary | ICD-10-CM | POA: Diagnosis not present

## 2020-09-02 DIAGNOSIS — I25118 Atherosclerotic heart disease of native coronary artery with other forms of angina pectoris: Secondary | ICD-10-CM | POA: Diagnosis present

## 2020-09-02 DIAGNOSIS — R7401 Elevation of levels of liver transaminase levels: Secondary | ICD-10-CM | POA: Diagnosis present

## 2020-09-02 DIAGNOSIS — D631 Anemia in chronic kidney disease: Secondary | ICD-10-CM | POA: Diagnosis present

## 2020-09-02 DIAGNOSIS — I2510 Atherosclerotic heart disease of native coronary artery without angina pectoris: Secondary | ICD-10-CM | POA: Diagnosis not present

## 2020-09-02 DIAGNOSIS — J188 Other pneumonia, unspecified organism: Secondary | ICD-10-CM | POA: Diagnosis present

## 2020-09-02 DIAGNOSIS — R54 Age-related physical debility: Secondary | ICD-10-CM | POA: Diagnosis present

## 2020-09-02 HISTORY — DX: End stage renal disease: Z99.2

## 2020-09-02 HISTORY — DX: Chronic respiratory failure with hypoxia: J96.11

## 2020-09-02 HISTORY — DX: Chronic obstructive pulmonary disease, unspecified: J44.9

## 2020-09-02 HISTORY — DX: Thrombocytopenia, unspecified: D69.6

## 2020-09-02 HISTORY — DX: End stage renal disease: N18.6

## 2020-09-02 HISTORY — DX: Anemia in other chronic diseases classified elsewhere: D63.8

## 2020-09-02 LAB — CBC
HCT: 32.3 % — ABNORMAL LOW (ref 39.0–52.0)
Hemoglobin: 10.7 g/dL — ABNORMAL LOW (ref 13.0–17.0)
MCH: 32 pg (ref 26.0–34.0)
MCHC: 33.1 g/dL (ref 30.0–36.0)
MCV: 96.7 fL (ref 80.0–100.0)
Platelets: 134 10*3/uL — ABNORMAL LOW (ref 150–400)
RBC: 3.34 MIL/uL — ABNORMAL LOW (ref 4.22–5.81)
RDW: 17.8 % — ABNORMAL HIGH (ref 11.5–15.5)
WBC: 9.2 10*3/uL (ref 4.0–10.5)
nRBC: 0.2 % (ref 0.0–0.2)

## 2020-09-02 LAB — COMPREHENSIVE METABOLIC PANEL
ALT: 202 U/L — ABNORMAL HIGH (ref 0–44)
AST: 179 U/L — ABNORMAL HIGH (ref 15–41)
Albumin: 3.2 g/dL — ABNORMAL LOW (ref 3.5–5.0)
Alkaline Phosphatase: 206 U/L — ABNORMAL HIGH (ref 38–126)
Anion gap: 14 (ref 5–15)
BUN: 51 mg/dL — ABNORMAL HIGH (ref 8–23)
CO2: 30 mmol/L (ref 22–32)
Calcium: 9.1 mg/dL (ref 8.9–10.3)
Chloride: 99 mmol/L (ref 98–111)
Creatinine, Ser: 6.08 mg/dL — ABNORMAL HIGH (ref 0.61–1.24)
GFR calc Af Amer: 10 mL/min — ABNORMAL LOW (ref >60)
GFR calc non Af Amer: 9 mL/min — ABNORMAL LOW (ref >60)
Glucose, Bld: 168 mg/dL — ABNORMAL HIGH (ref 70–99)
Potassium: 5.1 mmol/L (ref 3.5–5.1)
Sodium: 143 mmol/L (ref 135–145)
Total Bilirubin: 1.1 mg/dL (ref 0.3–1.2)
Total Protein: 6.3 g/dL — ABNORMAL LOW (ref 6.5–8.1)

## 2020-09-02 LAB — BRAIN NATRIURETIC PEPTIDE: B Natriuretic Peptide: >4500 pg/mL — ABNORMAL HIGH (ref 0.0–100.0)

## 2020-09-02 LAB — PROTIME-INR
INR: 1.2 (ref 0.8–1.2)
Prothrombin Time: 14.5 seconds (ref 11.4–15.2)

## 2020-09-02 LAB — GLUCOSE, CAPILLARY: Glucose-Capillary: 90 mg/dL (ref 70–99)

## 2020-09-02 LAB — RESPIRATORY PANEL BY RT PCR (FLU A&B, COVID)
Influenza A by PCR: NEGATIVE
Influenza B by PCR: NEGATIVE
SARS Coronavirus 2 by RT PCR: NEGATIVE

## 2020-09-02 LAB — PROCALCITONIN: Procalcitonin: 1.08 ng/mL

## 2020-09-02 LAB — LIPASE, BLOOD: Lipase: 37 U/L (ref 11–51)

## 2020-09-02 LAB — TROPONIN I (HIGH SENSITIVITY)
Troponin I (High Sensitivity): 602 ng/L (ref <18)
Troponin I (High Sensitivity): 577 ng/L (ref <18)

## 2020-09-02 LAB — APTT: aPTT: 35 seconds (ref 24–36)

## 2020-09-02 MED ORDER — ASPIRIN EC 81 MG PO TBEC
81.0000 mg | DELAYED_RELEASE_TABLET | Freq: Every day | ORAL | Status: DC
  Administered 2020-09-03 – 2020-09-06 (×3): 81 mg via ORAL
  Filled 2020-09-02 (×3): qty 1

## 2020-09-02 MED ORDER — INSULIN ASPART 100 UNIT/ML ~~LOC~~ SOLN: 0.0000 [IU] | Freq: Three times a day (TID) | SUBCUTANEOUS | Status: DC

## 2020-09-02 MED ORDER — INSULIN ASPART 100 UNIT/ML ~~LOC~~ SOLN: 0.0000 [IU] | Freq: Every day | SUBCUTANEOUS | Status: DC

## 2020-09-02 MED ORDER — HEPARIN BOLUS VIA INFUSION
3000.0000 [IU] | Freq: Once | INTRAVENOUS | Status: AC
  Administered 2020-09-02: 3000 [IU] via INTRAVENOUS
  Filled 2020-09-02: qty 3000

## 2020-09-02 MED ORDER — ASPIRIN 300 MG RE SUPP: 300.0000 mg | RECTAL | Status: AC

## 2020-09-02 MED ORDER — ATORVASTATIN CALCIUM 20 MG PO TABS: 40.0000 mg | ORAL_TABLET | Freq: Every day | ORAL | Status: DC

## 2020-09-02 MED ORDER — HEPARIN (PORCINE) 25000 UT/250ML-% IV SOLN
1200.0000 [IU]/h | INTRAVENOUS | Status: DC
  Administered 2020-09-02: 750 [IU]/h via INTRAVENOUS
  Administered 2020-09-03: 950 [IU]/h via INTRAVENOUS
  Filled 2020-09-02 (×3): qty 250

## 2020-09-02 MED ORDER — ONDANSETRON HCL 4 MG/2ML IJ SOLN: 4.0000 mg | Freq: Four times a day (QID) | INTRAMUSCULAR | Status: DC | PRN

## 2020-09-02 MED ORDER — ASPIRIN 81 MG PO CHEW
325.0000 mg | CHEWABLE_TABLET | Freq: Once | ORAL | Status: DC
  Filled 2020-09-02: qty 5

## 2020-09-02 MED ORDER — ALUM & MAG HYDROXIDE-SIMETH 200-200-20 MG/5ML PO SUSP
30.0000 mL | Freq: Once | ORAL | Status: AC
  Administered 2020-09-02: 30 mL via ORAL
  Filled 2020-09-02: qty 30

## 2020-09-02 MED ORDER — NITROGLYCERIN 0.4 MG SL SUBL: 0.4000 mg | SUBLINGUAL_TABLET | SUBLINGUAL | Status: DC | PRN

## 2020-09-02 MED ORDER — ACETAMINOPHEN 325 MG PO TABS
650.0000 mg | ORAL_TABLET | ORAL | Status: DC | PRN
  Administered 2020-09-02 – 2020-09-05 (×6): 650 mg via ORAL
  Filled 2020-09-02 (×7): qty 2

## 2020-09-02 MED ORDER — SODIUM CHLORIDE 0.9 % IV SOLN
500.0000 mg | INTRAVENOUS | Status: DC
  Administered 2020-09-02 – 2020-09-05 (×3): 500 mg via INTRAVENOUS
  Filled 2020-09-02 (×5): qty 500

## 2020-09-02 MED ORDER — LIDOCAINE VISCOUS HCL 2 % MT SOLN
15.0000 mL | Freq: Once | OROMUCOSAL | Status: AC
  Administered 2020-09-02: 15 mL via ORAL
  Filled 2020-09-02: qty 15

## 2020-09-02 MED ORDER — ASPIRIN 81 MG PO CHEW
324.0000 mg | CHEWABLE_TABLET | ORAL | Status: AC
  Administered 2020-09-02: 324 mg via ORAL

## 2020-09-02 MED ORDER — SODIUM CHLORIDE 0.9 % IV SOLN
1.0000 g | INTRAVENOUS | Status: DC
  Administered 2020-09-02 – 2020-09-05 (×3): 1 g via INTRAVENOUS
  Filled 2020-09-02 (×3): qty 10
  Filled 2020-09-02: qty 1
  Filled 2020-09-02: qty 10

## 2020-09-02 NOTE — H&P (Addendum)
History and Physical    Frank Proctor HYQ:[id number] DOB: 1951-04-12 DOA: 09/02/2020  PCP: [facility] [place]   Patient coming from: [facility]  I have personally briefly reviewed patient's old medical records in [place]  Chief Complaint: Chest pain  HPI: Frank Proctor is a 69 y.o. male with medical history significant of chronic respiratory failure secondary to COPD, on 2 L at home, type 1 diabetes, ESRD on HD(MWF), bilateral BKA, recently started on antibiotics for pneumonia brought to ED with complaint of substernal chest pain started approximately 1 hour prior to coming to ED. According to patient somebody turned off his oxygen at his facility and that is the reason he started some shortness of breath and chest pain.  Shortness of breath improved with restarting oxygen.,  Denies any nausea or vomiting.  He received one-time dose of sublingual nitroglycerin with some minor improvement in his chest pain.  He was describing chest pain as sharp, 7 out of 10 in intensity at this time, radiating to left shoulder.  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  Patient denies any pleuritic or musculoskeletal component as denies any change in pain with deep breathing or moving around.  Continue to have some cough, stating it is improving with antibiotics but could not remember the name. No recent change in his appetite or bowel habits.  Last dialysis was on Friday.  He still makes urine and denies any urinary symptoms.  [facility]  No med rec yet.  ED Course: Hemodynamically stable, EKG without any significant ST changes, labs with hemoglobin of 10.7, platelets of 134, BUN and creatinine consistent with ESRD, AST of 179, ALT of 202, alkaline phosphatase of 206, troponin at 602.  Chest x-ray with patchy bilateral airspace opacities with dense consolidation at the left lung base.  COVID-19 PCR negative. Patient was started on heparin  infusion.  Review of Systems: As per HPI otherwise 10 point review of systems negative.   Past Medical History:  Diagnosis Date   Anemia    Diabetes mellitus without complication ([place])    Hyperparathyroidism ([place])    Renal disorder     Past Surgical History:  Procedure Laterality Date   BELOW KNEE LEG AMPUTATION     CATARACT EXTRACTION W/ INTRAOCULAR LENS  IMPLANT, BILATERAL     CHOLECYSTECTOMY       reports that he has quit smoking. He does not have any smokeless tobacco history on file. He reports that he does not drink alcohol and does not use drugs.  Allergies  Allergen Reactions   Other     Pt reports has 2 allergies but not sure what    Family History  Family history unknown: Yes    Prior to Admission medications   Not on File    Physical Exam: Vitals:   09/02/20 1423 09/02/20 1503 09/02/20 1624  BP:  121/61   Pulse:  87   Resp:  17   Temp:  98.8 F (37.1 C)   TempSrc:  Oral   SpO2: 97% 98%   Weight:  66.2 kg 66.2 kg    General: Vital signs reviewed.  Patient is well-developed, in no acute distress and cooperative with exam.  Head: Normocephalic and atraumatic. Eyes: EOMI, conjunctivae normal, no scleral icterus.  ENMT: Mucous membranes are moist. Neck: Supple, trachea midline,  Cardiovascular: RRR, S1 normal, S2 normal, no murmurs, gallops, or rubs. Pulmonary/Chest: Clear to auscultation bilaterally, normal  work of breathing. Abdominal: Soft, non-tender, non-distended, BS +, Extremities: Bilateral BKA. Neurological: A&O x3, Strength is normal and symmetric bilaterally, cranial nerve II-XII are grossly intact, no focal deficit,  Skin: Warm, dry and intact.  Psychiatric: Normal mood and affect.   Labs on Admission: I have personally reviewed following labs and imaging studies  CBC: Recent Labs  Lab 09/02/20 1438  WBC 9.2  HGB 10.7*  HCT 32.3*  MCV 96.7  PLT 767*   Basic Metabolic Panel: Recent Labs  Lab 09/02/20 1438  NA 143    K 5.1  CL 99  CO2 30  GLUCOSE 168*  BUN 51*  CREATININE 6.08*  CALCIUM 9.1   GFR: CrCl cannot be calculated (Unknown ideal weight.). Liver Function Tests: Recent Labs  Lab 09/02/20 1438  AST 179*  ALT 202*  ALKPHOS 206*  BILITOT 1.1  PROT 6.3*  ALBUMIN 3.2*   Recent Labs  Lab 09/02/20 1438  LIPASE 37   No results for input(s): AMMONIA in the last 168 hours. Coagulation Profile: Recent Labs  Lab 09/02/20 1619  INR 1.2   Cardiac Enzymes: No results for input(s): CKTOTAL, CKMB, CKMBINDEX, TROPONINI in the last 168 hours. BNP (last 3 results) No results for input(s): PROBNP in the last 8760 hours. HbA1C: No results for input(s): HGBA1C in the last 72 hours. CBG: No results for input(s): GLUCAP in the last 168 hours. Lipid Profile: No results for input(s): CHOL, HDL, LDLCALC, TRIG, CHOLHDL, LDLDIRECT in the last 72 hours. Thyroid Function Tests: No results for input(s): TSH, T4TOTAL, FREET4, T3FREE, THYROIDAB in the last 72 hours. Anemia Panel: No results for input(s): VITAMINB12, FOLATE, FERRITIN, TIBC, IRON, RETICCTPCT in the last 72 hours. Urine analysis:    Component Value Date/Time   COLORURINE YELLOW (A) 01/11/2020 1032   APPEARANCEUR HAZY (A) 01/11/2020 1032   LABSPEC 1.018 01/11/2020 1032   PHURINE 5.0 01/11/2020 1032   GLUCOSEU NEGATIVE 01/11/2020 1032   HGBUR NEGATIVE 01/11/2020 1032   BILIRUBINUR NEGATIVE 01/11/2020 1032   [place] 01/11/2020 1032   PROTEINUR 100 (A) 01/11/2020 1032   NITRITE NEGATIVE 01/11/2020 1032   LEUKOCYTESUR NEGATIVE 01/11/2020 1032    Radiological Exams on Admission: DG Chest Port 1 View  Result Date: 09/02/2020 CLINICAL DATA:  Patient removed his oxygen. Reportedly recent pneumonia. EXAM: PORTABLE CHEST 1 VIEW COMPARISON:  01/11/2020 FINDINGS: Patchy bilateral airspace opacities with dense consolidation at the left lung base. Possible layering small left pleural effusion. No discernible pneumothorax. Mild  enlargement the cardiac silhouette. Aortic atherosclerosis. Multiple clips projecting over the bilateral arms and axilla. IMPRESSION: 1. Patchy bilateral airspace opacities with dense consolidation at the left lung base. Findings are concerning for multifocal pneumonia. Recommend follow-up to resolution. 2. Possible layering small left pleural effusion. 3. Mild cardiomegaly. Electronically Signed   By: Margaretha Sheffield MD   On: 09/02/2020 14:46    EKG: Independently reviewed.  Sinus rhythm, no significant ST changes.  Assessment/Plan Active Problems:   NSTEMI (non-ST elevated myocardial infarction) (HCC)   Elevated troponin/chest pain/NSTEMI.  Patient has chest pain with some typical components.  Troponin at 607, can be secondary to demand ischemia as his oxygen was taken away for couple of hours.  EKG without any acute ST changes.  High risk for NSTEMI secondary to diabetes, ESRD.  No prior cardiac history.  Most of his care is in [place] so not much records. -Give him one-time dose of aspirin 325 mg. -Start him on heparin infusion. -Sublingual nitroglycerin as needed.   -  Cardiology consult. -Echocardiogram. -Trend troponin. -Lipid profile. -We will need statin once transaminitis resolves.  Transaminitis.  Unknown baseline. -Continue to monitor.  Recent pneumonia.  Chest x-ray with left-sided consolidation along with some patchy bilateral infiltrates.  COVID-19 negative.  He was recently started on some antibiotics at his facility, does not remember the name. Afebrile with no leukocytosis.  Procalcitonin elevated at 1.08. -Start him on ceftriaxone and Zithromax.  Chronic respiratory failure secondary to COPD.  No sign of acute exacerbation.  Patient saturating well on his home oxygen of 2 L. -Continue with supplemental oxygen.  ESRD.  Nephrology was consulted. -Patient will continue his routine dialysis on Monday, Wednesday and Friday.  Type 1 diabetes.  -SSI.  Hypertension.  Blood  pressure within goal.  No med rec yet. -Continue to monitor.   DVT prophylaxis: Heparin infusion Code Status: DNR Family Communication: Wife was updated at bedside Disposition Plan: SNF  Consults called: Cardiology, nephrology Admission status: Inpatient.   Lorella Nimrod MD [facility]  If 7PM-7AM, please contact night-coverage [email or web address]  09/02/2020, 5:24 PM   This record has been created using Systems analyst. Errors have been sought and corrected,but may not always be located. Such creation errors do not reflect on the standard of care.

## 2020-09-02 NOTE — ED Triage Notes (Signed)
Per EMS and PT, called to [facility] d/t CP. Pt states that staff had removed his oxygen and is unsure of how long he "went without oxygen". Wears 2LNC baseline. Hx of pneumonia a "couple weeks ago". Pt is B/L BKA.

## 2020-09-02 NOTE — Consult Note (Signed)
ANTICOAGULATION CONSULT NOTE - Initial Consult  Pharmacy Consult for Heparin Drip Indication: chest pain/ACS/STEMI   Allergies  Allergen Reactions  . Other     Pt reports has 2 allergies but not sure what    Patient Measurements:   Heparin Dosing Weight: 66.2kg  Vital Signs: Temp: 98.8 F (37.1 C) (10/03 1503) Temp Source: Oral (10/03 1503) BP: 121/61 (10/03 1503) Pulse Rate: 87 (10/03 1503)  Labs: Recent Labs    09/02/20 1438  HGB 10.7*  HCT 32.3*  PLT 134*  CREATININE 6.08*  TROPONINIHS 602*    CrCl cannot be calculated (Unknown ideal weight.).   Medical History: Past Medical History:  Diagnosis Date  . Anemia   . Diabetes mellitus without complication ([place])   . Hyperparathyroidism ([place])   . Renal disorder     Medications:  No PTA anticoagulation of record  Assessment: 69 yo male with chest pain and elevated troponins (602).     Pharmacy has been consulted to initiate and monitor a heparin drip.  Goal of Therapy:  Heparin level 0.3-0.7 units/ml Monitor platelets by anticoagulation protocol: Yes   Plan:  Will obtain baseline APTT and INR values.  Will give 3000 unit heparin bolus, followed by 750 units/hr  Will check heparin level in 8 hours per protocol.  Daily CBC's  Lu Duffel, PharmD, BCPS Clinical Pharmacist 09/02/2020 4:02 PM

## 2020-09-02 NOTE — ED Notes (Signed)
Date and time results received: 09/02/20 1540 (use smartphrase ".now" to insert current time)  Test: troponin Critical Value: 602  Name of Provider Notified: wagner pa

## 2020-09-02 NOTE — ED Provider Notes (Signed)
[facility] Emergency Department Provider Note  ____________________________________________  Time seen: Approximately 2:25 PM  I have reviewed the triage vital signs and the nursing notes.   HISTORY  Chief Complaint Chest Pain    HPI JOHNNEY Proctor is a 69 y.o. male with a PMH of chronic respiratory failure on 2 L secondary to COPD, type 1 diabetes, end-stage renal disease on hemodialysis Monday Wednesday Friday that that presents to the emergency department for evaluation of substernal chest pain for about 1 hour prior to arrival.  Patient states that he is supposed to be on 2 L of oxygen but the nursing facility had his oxygen off, thinking he was only supposed to have oxygen on at night.  This is when he first noticed the chest pain.  He was treated a couple of weeks ago for pneumonia. No cough currently. No fever.   Past Medical History:  Diagnosis Date  . Anemia   . Diabetes mellitus without complication ([place])   . Hyperparathyroidism ([place])   . Renal disorder     There are no problems to display for this patient.   Past Surgical History:  Procedure Laterality Date  . BELOW KNEE LEG AMPUTATION    . CATARACT EXTRACTION W/ INTRAOCULAR LENS  IMPLANT, BILATERAL    . CHOLECYSTECTOMY      Prior to Admission medications   Not on File    Allergies Other  Family History  Family history unknown: Yes    Social History Social History   Tobacco Use  . Smoking status: Former Smoker  Substance Use Topics  . Alcohol use: No  . Drug use: No     Review of Systems  Constitutional: No fever/chills ENT: No upper respiratory complaints. Cardiovascular: Positive for chest pain. Respiratory: Positive for mild SOB. Gastrointestinal: No abdominal pain.  No nausea, no vomiting.  Musculoskeletal: Negative for musculoskeletal pain. Skin: Negative for rash, abrasions, lacerations, ecchymosis. Neurological: Negative for headaches, numbness or  tingling   ____________________________________________   PHYSICAL EXAM:  VITAL SIGNS: ED Triage Vitals  Enc Vitals Group     BP      Pulse      Resp      Temp      Temp src      SpO2      Weight      Height      Head Circumference      Peak Flow      Pain Score      Pain Loc      Pain Edu?      Excl. in [place]?      Constitutional: Alert and oriented. Well appearing and in no acute distress. Eyes: Conjunctivae are normal. PERRL. EOMI. Head: Atraumatic. ENT:      Ears:      Nose: No congestion/rhinnorhea.      Mouth/Throat: Mucous membranes are moist.  Neck: No stridor.   Cardiovascular: Normal rate, regular rhythm.  Good peripheral circulation. Respiratory: Normal respiratory effort without tachypnea or retractions. Lungs CTAB. Good air entry to the bases with no decreased or absent breath sounds. Gastrointestinal: Bowel sounds 4 quadrants.  Epigastric tenderness. No guarding or rigidity. No palpable masses. No distention. Musculoskeletal: Full range of motion to all extremities. No gross deformities appreciated. Neurologic:  Normal speech and language. No gross focal neurologic deficits are appreciated.  Skin:  Skin is warm, dry and intact. No rash noted. Psychiatric: Mood and affect are normal. Speech and behavior are normal. Patient  exhibits appropriate insight and judgement.   ____________________________________________   LABS (all labs ordered are listed, but only abnormal results are displayed)  Labs Reviewed  CBC - Abnormal; Notable for the following components:      Result Value   RBC 3.34 (*)    Hemoglobin 10.7 (*)    HCT 32.3 (*)    RDW 17.8 (*)    Platelets 134 (*)    All other components within normal limits  COMPREHENSIVE METABOLIC PANEL - Abnormal; Notable for the following components:   Glucose, Bld 168 (*)    BUN 51 (*)    Creatinine, Ser 6.08 (*)    Total Protein 6.3 (*)    Albumin 3.2 (*)    AST 179 (*)    ALT 202 (*)    Alkaline  Phosphatase 206 (*)    GFR calc non Af Amer 9 (*)    GFR calc Af Amer 10 (*)    All other components within normal limits  TROPONIN I (HIGH SENSITIVITY) - Abnormal; Notable for the following components:   Troponin I (High Sensitivity) 602 (*)    All other components within normal limits  RESPIRATORY PANEL BY RT PCR (FLU A&B, COVID)  LIPASE, BLOOD  URINALYSIS, COMPLETE (UACMP) WITH MICROSCOPIC  PROCALCITONIN  APTT  PROTIME-INR  HEPARIN LEVEL (UNFRACTIONATED)  TROPONIN I (HIGH SENSITIVITY)   ____________________________________________  EKG   ____________________________________________  RADIOLOGY Robinette Haines, personally viewed and evaluated these images (plain radiographs) as part of my medical decision making, as well as reviewing the written report by the radiologist.  DG Chest Port 1 View  Result Date: 09/02/2020 CLINICAL DATA:  Patient removed his oxygen. Reportedly recent pneumonia. EXAM: PORTABLE CHEST 1 VIEW COMPARISON:  01/11/2020 FINDINGS: Patchy bilateral airspace opacities with dense consolidation at the left lung base. Possible layering small left pleural effusion. No discernible pneumothorax. Mild enlargement the cardiac silhouette. Aortic atherosclerosis. Multiple clips projecting over the bilateral arms and axilla. IMPRESSION: 1. Patchy bilateral airspace opacities with dense consolidation at the left lung base. Findings are concerning for multifocal pneumonia. Recommend follow-up to resolution. 2. Possible layering small left pleural effusion. 3. Mild cardiomegaly. Electronically Signed   By: Margaretha Sheffield MD   On: 09/02/2020 14:46    ____________________________________________    PROCEDURES  Procedure(s) performed:    Procedures    Medications  heparin bolus via infusion 3,000 Units (has no administration in time range)    Followed by  heparin ADULT infusion 100 units/mL (25000 units/246mL sodium chloride 0.45%) (has no administration in time  range)     ____________________________________________   INITIAL IMPRESSION / ASSESSMENT AND PLAN / ED COURSE  Pertinent labs & imaging results that were available during my care of the patient were reviewed by me and considered in my medical decision making (see chart for details).  Review of the [place] CSRS was performed in accordance of the [place] prior to dispensing any controlled drugs.  Differential diagnosis includes, but is not limited to, ACS, aortic dissection, pulmonary embolism, cardiac tamponade, pneumothorax, pneumonia, pericarditis, myocarditis, GI-related causes including esophagitis/gastritis, and musculoskeletal chest wall pain.     SR on EKG. Troponin elevated at 602. Patient will be admitted for NSTEMI.   ----------------------------------------- 4:00 PM on 09/02/2020 -----------------------------------------  Hospitalist Dr. Lorella Nimrod is agreeable with admission.  ----------------------------------------- 4:49 PM on 09/02/2020 -----------------------------------------  Dr. Harrell Gave with cardiology is in agreement with plan.    CASSANDRA MCMANAMAN was evaluated in Emergency Department on 09/02/2020 for the symptoms  described in the history of present illness. He was evaluated in the context of the global COVID-19 pandemic, which necessitated consideration that the patient might be at risk for infection with the SARS-CoV-2 virus that causes COVID-19. Institutional protocols and algorithms that pertain to the evaluation of patients at risk for COVID-19 are in a state of rapid change based on information released by regulatory bodies including the CDC and federal and state organizations. These policies and algorithms were followed during the patient's care in the ED.  ____________________________________________  FINAL CLINICAL IMPRESSION(S) / ED DIAGNOSES  Final diagnoses:  Chest pain      NEW MEDICATIONS STARTED DURING THIS VISIT:  ED Discharge Orders     None          This chart was dictated using voice recognition software/Dragon. Despite best efforts to proofread, errors can occur which can change the meaning. Any change was purely unintentional.    Laban Emperor, PA-C 09/03/20 1024    Lucrezia Starch, MD 09/03/20 1125

## 2020-09-02 NOTE — ED Notes (Signed)
Pt resting quietly at this time, respirations equal and unlabored, continued to monitor. IV consult placed for second IV to hang antibiotics.

## 2020-09-03 ENCOUNTER — Encounter: Payer: Self-pay | Admitting: Internal Medicine

## 2020-09-03 ENCOUNTER — Inpatient Hospital Stay (HOSPITAL_COMMUNITY)
Admit: 2020-09-03 | Discharge: 2020-09-03 | Disposition: A | Discharge status: Home / Self Care | Payer: No Typology Code available for payment source | Attending: Internal Medicine | Admitting: Internal Medicine

## 2020-09-03 ENCOUNTER — Other Ambulatory Visit: Payer: Self-pay

## 2020-09-03 DIAGNOSIS — I10 Essential (primary) hypertension: Secondary | ICD-10-CM

## 2020-09-03 DIAGNOSIS — N186 End stage renal disease: Secondary | ICD-10-CM

## 2020-09-03 DIAGNOSIS — I214 Non-ST elevation (NSTEMI) myocardial infarction: Principal | ICD-10-CM

## 2020-09-03 DIAGNOSIS — I248 Other forms of acute ischemic heart disease: Secondary | ICD-10-CM

## 2020-09-03 DIAGNOSIS — J188 Other pneumonia, unspecified organism: Secondary | ICD-10-CM | POA: Diagnosis present

## 2020-09-03 DIAGNOSIS — I5021 Acute systolic (congestive) heart failure: Secondary | ICD-10-CM

## 2020-09-03 DIAGNOSIS — I361 Nonrheumatic tricuspid (valve) insufficiency: Secondary | ICD-10-CM

## 2020-09-03 DIAGNOSIS — I340 Nonrheumatic mitral (valve) insufficiency: Secondary | ICD-10-CM

## 2020-09-03 DIAGNOSIS — L8990 Pressure ulcer of unspecified site, unspecified stage: Secondary | ICD-10-CM | POA: Insufficient documentation

## 2020-09-03 DIAGNOSIS — J189 Pneumonia, unspecified organism: Secondary | ICD-10-CM | POA: Diagnosis present

## 2020-09-03 LAB — ECHOCARDIOGRAM COMPLETE
AR max vel: 1.56 cm2
AV Area VTI: 1.83 cm2
AV Area mean vel: 1.7 cm2
AV Mean grad: 3 mmHg
AV Peak grad: 5.4 mmHg
Ao pk vel: 1.16 m/s
Area-P 1/2: 3.53 cm2
Calc EF: 30 %
S' Lateral: 4.82 cm
Single Plane A2C EF: 29.6 %
Single Plane A4C EF: 24.1 %
Weight: 2336 oz

## 2020-09-03 LAB — HIV ANTIBODY (ROUTINE TESTING W REFLEX): HIV Screen 4th Generation wRfx: NONREACTIVE

## 2020-09-03 LAB — GLUCOSE, CAPILLARY
Glucose-Capillary: 118 mg/dL — ABNORMAL HIGH (ref 70–99)
Glucose-Capillary: 124 mg/dL — ABNORMAL HIGH (ref 70–99)
Glucose-Capillary: 150 mg/dL — ABNORMAL HIGH (ref 70–99)
Glucose-Capillary: 146 mg/dL — ABNORMAL HIGH (ref 70–99)

## 2020-09-03 LAB — LIPID PANEL
Cholesterol: 55 mg/dL (ref 0–200)
HDL: 37 mg/dL — ABNORMAL LOW (ref >40)
LDL Cholesterol: 15 mg/dL (ref 0–99)
Total CHOL/HDL Ratio: 1.5 RATIO
Triglycerides: 17 mg/dL (ref <150)
VLDL: 3 mg/dL (ref 0–40)

## 2020-09-03 LAB — HEMOGLOBIN A1C
Hgb A1c MFr Bld: 7.2 % — ABNORMAL HIGH (ref 4.8–5.6)
Mean Plasma Glucose: 159.94 mg/dL

## 2020-09-03 LAB — HEPARIN LEVEL (UNFRACTIONATED): Heparin Unfractionated: 0.28 IU/mL — ABNORMAL LOW (ref 0.30–0.70)

## 2020-09-03 MED ORDER — LIDOCAINE 5 % EX PTCH
1.0000 | MEDICATED_PATCH | Freq: Every day | CUTANEOUS | Status: DC
  Administered 2020-09-03 – 2020-09-06 (×4): 1 via TRANSDERMAL
  Filled 2020-09-03 (×4): qty 1

## 2020-09-03 MED ORDER — SODIUM CHLORIDE 0.9 % IV SOLN: 100.0000 mL | INTRAVENOUS | Status: DC | PRN

## 2020-09-03 MED ORDER — ATORVASTATIN CALCIUM 10 MG PO TABS
5.0000 mg | ORAL_TABLET | Freq: Every day | ORAL | Status: DC
  Administered 2020-09-03 – 2020-09-06 (×3): 5 mg via ORAL
  Filled 2020-09-03: qty 0.5
  Filled 2020-09-03 (×3): qty 1

## 2020-09-03 MED ORDER — MENTHOL 3 MG MT LOZG
1.0000 | LOZENGE | OROMUCOSAL | Status: DC | PRN
  Filled 2020-09-03: qty 9

## 2020-09-03 MED ORDER — GABAPENTIN 300 MG PO CAPS
300.0000 mg | ORAL_CAPSULE | Freq: Every evening | ORAL | Status: DC
  Administered 2020-09-03 – 2020-09-05 (×2): 300 mg via ORAL
  Filled 2020-09-03 (×2): qty 1

## 2020-09-03 MED ORDER — HYPROMELLOSE (GONIOSCOPIC) 2.5 % OP SOLN
2.0000 [drp] | Freq: Four times a day (QID) | OPHTHALMIC | Status: DC | PRN
  Filled 2020-09-03: qty 15

## 2020-09-03 MED ORDER — INFLUENZA VAC A&B SA ADJ QUAD 0.5 ML IM PRSY
0.5000 mL | PREFILLED_SYRINGE | INTRAMUSCULAR | Status: AC
  Administered 2020-09-06: 0.5 mL via INTRAMUSCULAR
  Filled 2020-09-03: qty 0.5

## 2020-09-03 MED ORDER — LOPERAMIDE HCL 2 MG PO CAPS
4.0000 mg | ORAL_CAPSULE | Freq: Once | ORAL | Status: AC
  Administered 2020-09-03: 4 mg via ORAL
  Filled 2020-09-03: qty 2

## 2020-09-03 MED ORDER — CHLORHEXIDINE GLUCONATE CLOTH 2 % EX PADS
6.0000 | MEDICATED_PAD | Freq: Every day | CUTANEOUS | Status: DC
  Administered 2020-09-04 – 2020-09-06 (×3): 6 via TOPICAL
  Filled 2020-09-03: qty 6

## 2020-09-03 MED ORDER — ALTEPLASE 2 MG IJ SOLR
2.0000 mg | Freq: Once | INTRAMUSCULAR | Status: DC | PRN
  Filled 2020-09-03: qty 2

## 2020-09-03 MED ORDER — LIDOCAINE-PRILOCAINE 2.5-2.5 % EX CREA
1.0000 "application " | TOPICAL_CREAM | CUTANEOUS | Status: DC | PRN
  Filled 2020-09-03: qty 5

## 2020-09-03 MED ORDER — SEVELAMER CARBONATE 800 MG PO TABS
2400.0000 mg | ORAL_TABLET | Freq: Three times a day (TID) | ORAL | Status: DC
  Administered 2020-09-03 – 2020-09-06 (×5): 2400 mg via ORAL
  Filled 2020-09-03 (×7): qty 3

## 2020-09-03 MED ORDER — PENTAFLUOROPROP-TETRAFLUOROETH EX AERO
1.0000 "application " | INHALATION_SPRAY | CUTANEOUS | Status: DC | PRN
  Filled 2020-09-03: qty 30

## 2020-09-03 MED ORDER — LIDOCAINE HCL (PF) 1 % IJ SOLN
5.0000 mL | INTRAMUSCULAR | Status: DC | PRN
  Filled 2020-09-03: qty 5

## 2020-09-03 MED ORDER — FERROUS SULFATE 325 (65 FE) MG PO TABS
325.0000 mg | ORAL_TABLET | Freq: Two times a day (BID) | ORAL | Status: DC
  Administered 2020-09-03 – 2020-09-06 (×4): 325 mg via ORAL
  Filled 2020-09-03 (×5): qty 1

## 2020-09-03 NOTE — ED Notes (Signed)
Per Patient Wife, patient does not do well with anaesthesia please call her

## 2020-09-03 NOTE — Progress Notes (Signed)
Pt still coughing quite a bit and sounds congested.  ST eval ordered.

## 2020-09-03 NOTE — Progress Notes (Signed)
[facility]  ROUNDING NOTE   Subjective:   [name] is a 69 years old gentleman well known to our practice. He is coming to ED today with SOB and chest pain.Patient is found to be fluid overloaded,will dialysis him today with ultrafiltration goal of 2-2.5 liters as tolerated.  He is on 2 L O2 via nasal cannula, reports feeling better now.   Objective:  Vital signs in last 24 hours:  Pulse Rate:  [57-79] 71 (10/04 1430) Resp:  [11-20] 13 (10/04 1330) BP: (59-169)/(34-86) 130/71 (10/04 1430) SpO2:  [91 %-100 %] 100 % (10/04 1430) Weight:  [66.2 kg] 66.2 kg (10/03 1624)  Weight change:  Filed Weights   09/02/20 1503 09/02/20 1624  Weight: 66.2 kg 66.2 kg    Intake/Output: I/O last 3 completed shifts: In: 34 [P.O.:240; IV Piggyback:250] Out: -    Intake/Output this shift:  No intake/output data recorded.  Physical Exam: General: Resting in bed,in no acute distress  Head: Normocephalic, atraumatic. 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  CBC: Recent Labs  Lab 09/02/20 1438  WBC 9.2  HGB 10.7*  HCT 32.3*  MCV 96.7  PLT 134*    Cardiac Enzymes: No results for input(s): CKTOTAL, CKMB, CKMBINDEX,  TROPONINI in the last 168 hours.  BNP: Invalid input(s): POCBNP  CBG: Recent Labs  Lab 09/02/20 2310 09/03/20 0904 09/03/20 1244  GLUCAP 90 118* 124*    Microbiology: Results for orders placed or performed during the hospital encounter of 09/02/20  Respiratory Panel by RT PCR (Flu A&B, Covid) - Nasopharyngeal Swab     Status: None   Collection Time: 09/02/20  3:06 PM   Specimen: Nasopharyngeal Swab  Result Value Ref Range Status   SARS Coronavirus 2 by RT PCR NEGATIVE NEGATIVE Final    Comment: (NOTE) SARS-CoV-2 target nucleic acids are NOT DETECTED.  The SARS-CoV-2 RNA is generally detectable in upper respiratoy specimens during the acute phase of infection. The lowest concentration of SARS-CoV-2 viral copies this assay can detect is 131 copies/mL. A negative result does not preclude SARS-Cov-2 infection and should not be used as the sole basis for treatment or other patient management decisions. A negative result may occur with  improper specimen collection/handling, submission of specimen other than nasopharyngeal swab, presence of viral mutation(s) within the areas targeted by this assay, and inadequate number of viral copies (<131 copies/mL). A negative result must be combined with clinical observations, patient history, and epidemiological information. The expected result is Negative.  Fact Sheet for Patients:  PinkCheek.be  Fact Sheet for Healthcare Providers:  GravelBags.it  This test is no t yet  approved or cleared by the [place] and  has been authorized for detection and/or diagnosis of SARS-CoV-2 by FDA under an Emergency Use Authorization (EUA). This EUA will remain  in effect (meaning this test can be used) for the duration of the COVID-19 declaration under Section 564(b)(1) of the Act, 21 U.S.C. section 360bbb-3(b)(1), unless the authorization is terminated or revoked sooner.      Influenza A by PCR NEGATIVE NEGATIVE Final   Influenza B by PCR NEGATIVE NEGATIVE Final    Comment: (NOTE) The Xpert Xpress SARS-CoV-2/FLU/RSV assay is intended as an aid in  the diagnosis of influenza from Nasopharyngeal swab specimens and  should not be used as a sole basis for treatment. Nasal washings and  aspirates are unacceptable for Xpert Xpress SARS-CoV-2/FLU/RSV  testing.  Fact Sheet for Patients: PinkCheek.be  Fact Sheet for Healthcare Providers: GravelBags.it  This test is not yet approved or cleared by the [place] FDA and  has been authorized for detection and/or diagnosis of SARS-CoV-2 by  FDA under an Emergency Use Authorization (EUA). This EUA will remain  in effect (meaning this test can be used) for the duration of the  Covid-19 declaration under Section 564(b)(1) of the Act, 21  U.S.C. section 360bbb-3(b)(1), unless the authorization is  terminated or revoked. Performed at [facility], [address] [place], [place] [address]     Coagulation Studies: Recent Labs    09/02/20 1619  LABPROT 14.5  INR 1.2    Urinalysis: No results for input(s): COLORURINE, LABSPEC, PHURINE, GLUCOSEU, HGBUR, BILIRUBINUR, KETONESUR, PROTEINUR, UROBILINOGEN, NITRITE, LEUKOCYTESUR in the last 72 hours.  Invalid input(s): APPERANCEUR    Imaging: DG Chest Port 1 View  Result Date: 09/02/2020 CLINICAL DATA:  Patient removed his oxygen. Reportedly recent pneumonia. EXAM: PORTABLE CHEST 1 VIEW COMPARISON:  01/11/2020 FINDINGS: Patchy bilateral airspace opacities with dense consolidation at the left lung base. Possible layering small left pleural effusion. No discernible pneumothorax. Mild enlargement the cardiac silhouette. Aortic atherosclerosis. Multiple clips projecting over the bilateral arms and axilla. IMPRESSION: 1. Patchy bilateral airspace opacities with dense consolidation at the left lung base.  Findings are concerning for multifocal pneumonia. Recommend follow-up to resolution. 2. Possible layering small left pleural effusion. 3. Mild cardiomegaly. Electronically Signed   By: Margaretha Sheffield MD   On: 09/02/2020 14:46   ECHOCARDIOGRAM COMPLETE  Result Date: 09/03/2020    ECHOCARDIOGRAM REPORT   Patient Name:   Frank Proctor Date of Exam: 09/03/2020 Medical Rec #:  [id number]        Height:       68.0 in Accession #:    [id number]       Weight:       146.0 lb Date of Birth:  1950/12/07       BSA:          1.788 m Patient Age:    70 years         BP:           98/41 mmHg Patient Gender: M                HR:           58 bpm. Exam Location:  [facility] Procedure: 2D Echo, Cardiac Doppler and Color Doppler Indications:     Acute coronary syndrome 124.9  History:         Patient has no prior history of Echocardiogram examinations.  Risk Factors:Diabetes.  Sonographer:     Sherrie Sport RDCS (AE) Referring Phys:  [id number] [facility] AMIN Diagnosing Phys: Kathlyn Sacramento MD IMPRESSIONS  1. Left ventricular ejection fraction, by estimation, is 25 to 30%. The left ventricle has severely decreased function. The left ventricle demonstrates global hypokinesis. There is mild left ventricular hypertrophy. Left ventricular diastolic parameters  are consistent with Grade II diastolic dysfunction (pseudonormalization).  2. Right ventricular systolic function is normal. The right ventricular size is normal. There is severely elevated pulmonary artery systolic pressure. The estimated right ventricular systolic pressure is 74.2 mmHg.  3. Left atrial size was moderately dilated.  4. Right atrial size was moderately dilated.  5. Moderate pleural effusion in the left lateral region.  6. The mitral valve is normal in structure. Mild mitral valve regurgitation. No evidence of mitral stenosis. Moderate mitral annular calcification.  7. Tricuspid valve regurgitation is moderate.  8. The aortic valve is normal in structure.  Aortic valve regurgitation is not visualized. Mild aortic valve sclerosis is present, with no evidence of aortic valve stenosis.  9. Moderately dilated pulmonary artery. 10. The inferior vena cava is dilated in size with >50% respiratory variability, suggesting right atrial pressure of 8 mmHg. FINDINGS  Left Ventricle: Left ventricular ejection fraction, by estimation, is 25 to 30%. The left ventricle has severely decreased function. The left ventricle demonstrates global hypokinesis. The left ventricular internal cavity size was normal in size. There is mild left ventricular hypertrophy. Left ventricular diastolic parameters are consistent with Grade II diastolic dysfunction (pseudonormalization). Right Ventricle: The right ventricular size is normal. No increase in right ventricular wall thickness. Right ventricular systolic function is normal. There is severely elevated pulmonary artery systolic pressure. The tricuspid regurgitant velocity is 3.83 m/s, and with an assumed right atrial pressure of 8 mmHg, the estimated right ventricular systolic pressure is 59.5 mmHg. Left Atrium: Left atrial size was moderately dilated. Right Atrium: Right atrial size was moderately dilated. Pericardium: There is no evidence of pericardial effusion. Mitral Valve: The mitral valve is normal in structure. Moderate mitral annular calcification. Mild mitral valve regurgitation. No evidence of mitral valve stenosis. Tricuspid Valve: The tricuspid valve is normal in structure. Tricuspid valve regurgitation is moderate . No evidence of tricuspid stenosis. Aortic Valve: The aortic valve is normal in structure. Aortic valve regurgitation is not visualized. Mild aortic valve sclerosis is present, with no evidence of aortic valve stenosis. Aortic valve mean gradient measures 3.0 mmHg. Aortic valve peak gradient measures 5.4 mmHg. Aortic valve area, by VTI measures 1.83 cm. Pulmonic Valve: The pulmonic valve was normal in structure.  Pulmonic valve regurgitation is mild. No evidence of pulmonic stenosis. Aorta: The aortic root is normal in size and structure. Pulmonary Artery: The pulmonary artery is moderately dilated. Venous: The inferior vena cava is dilated in size with greater than 50% respiratory variability, suggesting right atrial pressure of 8 mmHg. IAS/Shunts: No atrial level shunt detected by color flow Doppler. Additional Comments: There is a moderate pleural effusion in the left lateral region.  LEFT VENTRICLE PLAX 2D LVIDd:         5.43 cm     Diastology LVIDs:         4.82 cm     LV e' medial:    5.66 cm/s LV PW:         1.15 cm     LV E/e' medial:  18.9 LV IVS:        1.13 cm     LV  e' lateral:   7.40 cm/s LVOT diam:     2.00 cm     LV E/e' lateral: 14.5 LV SV:         50 LV SV Index:   28 LVOT Area:     3.14 cm  LV Volumes (MOD) LV vol d, MOD A2C: 80.4 ml LV vol d, MOD A4C: 67.1 ml LV vol s, MOD A2C: 56.6 ml LV vol s, MOD A4C: 50.9 ml LV SV MOD A2C:     23.8 ml LV SV MOD A4C:     67.1 ml LV SV MOD BP:      22.9 ml RIGHT VENTRICLE RV Basal diam:  4.57 cm RV S prime:     9.46 cm/s TAPSE (M-mode): 4.3 cm LEFT ATRIUM              Index       RIGHT ATRIUM           Index LA diam:        4.10 cm  2.29 cm/m  RA Area:     32.70 cm LA Vol (A2C):   122.0 ml 68.24 ml/m RA Volume:   121.00 ml 67.68 ml/m LA Vol (A4C):   45.9 ml  25.67 ml/m LA Biplane Vol: 78.7 ml  44.02 ml/m  AORTIC VALVE                   PULMONIC VALVE AV Area (Vmax):    1.56 cm    PV Vmax:        0.54 m/s AV Area (Vmean):   1.70 cm    PV Peak grad:   1.2 mmHg AV Area (VTI):     1.83 cm    RVOT Peak grad: 3 mmHg AV Vmax:           116.33 cm/s AV Vmean:          78.200 cm/s AV VTI:            0.273 m AV Peak Grad:      5.4 mmHg AV Mean Grad:      3.0 mmHg LVOT Vmax:         57.60 cm/s LVOT Vmean:        42.200 cm/s LVOT VTI:          0.159 m LVOT/AV VTI ratio: 0.58  AORTA Ao Root diam: 3.00 cm MITRAL VALVE                TRICUSPID VALVE MV Area (PHT): 3.53 cm      TR Peak grad:   58.7 mmHg MV Decel Time: 215 msec     TR Vmax:        383.00 cm/s MV E velocity: 107.00 cm/s MV A velocity: 98.00 cm/s   SHUNTS MV E/A ratio:  1.09         Systemic VTI:  0.16 m                             Systemic Diam: 2.00 cm Kathlyn Sacramento MD Electronically signed by Kathlyn Sacramento MD Signature Date/Time: 09/03/2020/10:07:47 AM    Final      Medications:   . sodium chloride    . sodium chloride    . azithromycin Stopped (09/03/20 0108)  . cefTRIAXone (ROCEPHIN)  IV Stopped (09/02/20 1805)  . heparin 950 Units/hr (09/03/20 0342)   . aspirin EC  81 mg Oral Daily  .  atorvastatin  5 mg Oral Daily  . [START ON 09/04/2020] Chlorhexidine Gluconate Cloth  6 each Topical Q0600  . ferrous sulfate  325 mg Oral BID WC  . gabapentin  300 mg Oral QPM  . insulin aspart  0-5 Units Subcutaneous QHS  . insulin aspart  0-6 Units Subcutaneous TID WC  . lidocaine  1 patch Transdermal Daily  . sevelamer carbonate  2,400 mg Oral TID WC   sodium chloride, sodium chloride, acetaminophen, alteplase, hydroxypropyl methylcellulose / hypromellose, lidocaine (PF), lidocaine-prilocaine, menthol-cetylpyridinium, nitroGLYCERIN, ondansetron (ZOFRAN) IV, pentafluoroprop-tetrafluoroeth  Assessment/ Plan:  [name] is a 69 y.o.  male old gentleman well known to our practice. He is coming to ED today with SOB and chest pain.Patient is found to be fluid overloaded,will dialysis him today with ultrafiltration goal of 2-2.5 liters as tolerated.  He is on 2 L O2 via nasal cannula, reports feeling better now  ESRD on dialysis MWF Right upper arm AV fistula We will plan to dialyze him today, ultrafiltration goal 2 to 2.5 L as tolerated. Dialysis orders in place  Chest pain/shortness of breath/ACS Troponin elevated in ED Received aspirin On heparin infusion  Echocardiogram today and possible cardiac cath tomorrow Management per cardiology team  Hypoxic respiratory failure Possibly a  combination of COPD and fluid overload BNP >4500 Patient stable on 2 L of supplemental O2 via nasal cannula Reports oxygen was off for a little while yesterday which caused worsening of shortness of breath  Hypertension Patient's blood pressure readings within low normal range We will continue monitoring closely      LOS: 1 Loreta Blouch 10/4/20213:40 PM

## 2020-09-03 NOTE — Consult Note (Signed)
ANTICOAGULATION CONSULT NOTE - Initial Consult  Pharmacy Consult for Heparin Drip Indication: chest pain/ACS/STEMI   Allergies  Allergen Reactions   Lisinopril    Other     Pt reports has 2 allergies but not sure what Other reaction(s): Other (See Comments) Allergic to 2 meds - does not remember names or kind of meds or reactions   Pork-Derived Products     Eating pork causes increased potassium   Septra [Sulfamethoxazole-Trimethoprim]     Patient Measurements: Weight: 66.2 kg (146 lb) Heparin Dosing Weight: 66.2kg  Vital Signs: Temp: 98.8 F (37.1 C) (10/03 1503) Temp Source: Oral (10/03 1503) BP: 117/65 (10/04 0100) Pulse Rate: 65 (10/04 0100)  Labs: Recent Labs    09/02/20 1438 09/02/20 1619 09/03/20 0116  HGB 10.7*  --   --   HCT 32.3*  --   --   PLT 134*  --   --   APTT  --  35  --   LABPROT  --  14.5  --   INR  --  1.2  --   HEPARINUNFRC  --   --  0.28*  CREATININE 6.08*  --   --   TROPONINIHS 602* 577*  --     CrCl cannot be calculated (Unknown ideal weight.).   Medical History: Past Medical History:  Diagnosis Date   Anemia    Diabetes mellitus without complication ([place])    Hyperparathyroidism ([place])    Renal disorder     Medications:  No PTA anticoagulation of record  Assessment: 69 yo male with chest pain and elevated troponins (602).     Pharmacy has been consulted to initiate and monitor a heparin drip.  Goal of Therapy:  Heparin level 0.3-0.7 units/ml Monitor platelets by anticoagulation protocol: Yes   Plan:   Will obtain baseline APTT and INR values.  Will give 3000 unit heparin bolus, followed by 750 units/hr  Will check heparin level in 8 hours per protocol.  Daily CBC's  1004 0116 HL 0.28, barely SUBtherapeutic.  Will increase Heparin infusion to 950 units/hr and recheck HL ~8 hours after rate change.   Ena Dawley, PharmD Clinical Pharmacist 09/03/2020 2:42 AM

## 2020-09-03 NOTE — Progress Notes (Signed)
  Pt got choked on dinner and has been coughing a lot.  Pt was eating rice and chicken.  Pt stated he has to gum it bc he does not have his dentures.

## 2020-09-03 NOTE — ED Notes (Addendum)
NPO after midnight tonight for cath tomorrow

## 2020-09-03 NOTE — Progress Notes (Signed)
*  PRELIMINARY RESULTS* Echocardiogram 2D Echocardiogram has been performed.  Frank Proctor 09/03/2020, 9:18 AM

## 2020-09-03 NOTE — Progress Notes (Signed)
Pt has had 6 loose bm's in last 2 hours.  NP notified.  Imodium ordered.

## 2020-09-03 NOTE — Consult Note (Addendum)
Cardiology Consultation:   Patient ID: Frank Proctor; [id number]; December 10, 1950   Admit date: 09/02/2020 Date of Consult: 09/03/2020  Primary Care Provider: [facility] Primary Cardiologist: New to [facility] - consult by Fletcher Anon Primary Electrophysiologist:  None   Patient Profile:   Frank Proctor is a 69 y.o. male with a hx of chronic hypoxic respiratory failure on supplemental oxygen via nasal cannula 2 L for an unknown duration secondary to COPD, type 2 diabetes, anemia of chronic disease, status post bilateral BKA secondary to what appears to be nonhealing ulcer/osteomyelitis with the right lower extremity being amputated in 2005 or 2006 and the left lower extremity being amputated "a while ago", ESRD on HD Monday, Wednesday, and Friday for uncertain duration who is followed by the [facility] health system who is being seen today for the evaluation of chest pain in the setting of multifocal pneumonia at the request of Dr. Reesa Chew.  History of Present Illness:   Frank Proctor has no previously known cardiac history.  He has been on supplemental oxygen for months to years, he has not certain exactly how long he has been on oxygen therapy.  Nonetheless, he was recently treated as an outpatient for pneumonia.  Indicates on 10/3 someone from his living facility came in and turned off his supplemental oxygen.  He is unclear why they did this.  His nurse noted altered mental status and realized his supplemental oxygen was turned off.  Upon turning his back on he complained of substernal chest discomfort rated 8 out of 10 along with associated shortness of breath.  He denied any diaphoresis, nausea, vomiting, or palpitations.  EMS was contacted with the patient given [place] nitroglycerin x1 with slight improvement in chest discomfort.  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  Upon the  patient's arrival to [facility] they were found to have BP in the 470J to 628Z systolic, more recently into the 66Q systolic, HR stable, temp afebrile, oxygen saturation 98% on supplemental oxygen at 2 L via nasal cannula. EKG showed NSR, 69 bpm, possible prior anterior infarct, nonspecific lateral ST-T changes which appear new when compared to prior tracing, CXR showed patchy bilateral airspace opacities with dense consolidation at the left base concerning for multifocal pneumonia with possible layering of a small left pleural effusion and mild cardiomegaly. Labs showed an initial high-sensitivity troponin 602 with a delta troponin of 577, PCT 1.08, Covid negative, BNP greater than 4500, lipase normal, WBC 9.2, Hgb 10.7, PLT 134, potassium 5.1, AST 179, ALT 202, albumin 3.2, LDL 15.  In the ED he was given Mylanta, aspirin, azithromycin, Rocephin, and started on a heparin drip.  Upon admission cardiology was asked to evaluate his chest pain.  Currently, he continues to have a cough productive of green sputum.  He also continues to note 7 out of 10 chest pain though is resting comfortably in bed and was sleeping upon my arrival.  Past Medical History:  Diagnosis Date  . Anemia   . Diabetes mellitus without complication ([place])   . Hyperparathyroidism ([place])   . Renal disorder     Past Surgical History:  Procedure Laterality Date  . BELOW KNEE LEG AMPUTATION    . CATARACT EXTRACTION W/ INTRAOCULAR LENS  IMPLANT, BILATERAL    . CHOLECYSTECTOMY       Home Meds: Prior to Admission medications   Medication Sig Start Date  End Date Taking? Authorizing Provider  acetaminophen (TYLENOL) 325 MG tablet Take 975 mg by mouth every 8 (eight) hours as needed for mild pain or moderate pain.   Yes [provider]  ascorbic acid (VITAMIN C) 500 MG tablet Take 500 mg by mouth 3 (three) times daily.   Yes [provider]  aspirin EC 81 MG tablet Take 81 mg by mouth daily.   Yes [provider]    atorvastatin (LIPITOR) 10 MG tablet Take 5 mg by mouth daily.   Yes [provider]  B Complex-C-Folic Acid (VIRT-CAPS) 1 MG CAPS Take 1 capsule by mouth daily.   Yes [provider]  dextrose (GLUTOSE) 40 % GEL Take 1 Tube by mouth once as needed for low blood sugar. (glucose reading <80)   Yes [provider]  ferrous sulfate 325 (65 FE) MG tablet Take 325 mg by mouth 2 (two) times daily with a meal.   Yes [provider]  gabapentin (NEURONTIN) 300 MG capsule Take 300 mg by mouth every evening.   Yes [provider]  glucagon (GLUCAGEN HYPOKIT) 1 MG SOLR injection Inject 1 mg into the muscle once as needed for low blood sugar. (if patient is unresponsive)   Yes [provider]  hydroxypropyl methylcellulose / hypromellose (ISOPTO TEARS / GONIOVISC) 2.5 % ophthalmic solution Place 2 drops into both eyes 4 (four) times daily as needed for dry eyes.   Yes [provider]  insulin aspart (NOVOLOG) 100 UNIT/ML injection Inject 0-12 Units into the skin See admin instructions. Inject under the skin twice daily according to sliding scale: 200-249: 2u 250-299: 4u 300-349: 6u 350-399: 8u 400-449: 10u 450-499: 12u 500 or greater: contact physician   Yes [provider]  Lidocaine 4 % PTCH Apply 1 patch topically daily. (remove after 12 hours)   Yes [provider]  losartan (COZAAR) 25 MG tablet Take 25 mg by mouth daily.   Yes [provider]  menthol-cetylpyridinium (CEPACOL) 3 MG lozenge Take 1 lozenge by mouth every 2 (two) hours as needed for sore throat.   Yes [provider]  midodrine (PROAMATINE) 10 MG tablet Take 10 mg by mouth every Monday, Wednesday, and Friday. (take 30 minutes prior to dialysis)   Yes [provider]  sevelamer carbonate (RENVELA) 800 MG tablet Take 2,400 mg by mouth 3 (three) times daily with meals.   Yes [provider]  zinc oxide 20 % ointment Apply 1  application topically as directed. (apply to buttocks every shift)   Yes [provider]    Inpatient Medications: Scheduled Meds: . aspirin EC  81 mg Oral Daily  . insulin aspart  0-5 Units Subcutaneous QHS  . insulin aspart  0-6 Units Subcutaneous TID WC   Continuous Infusions: . azithromycin Stopped (09/03/20 0108)  . cefTRIAXone (ROCEPHIN)  IV Stopped (09/02/20 1805)  . heparin 950 Units/hr (09/03/20 0342)   PRN Meds: acetaminophen, nitroGLYCERIN, ondansetron (ZOFRAN) IV  Allergies:   Allergies  Allergen Reactions  . Lisinopril   . Other     Pt reports has 2 allergies but not sure what Other reaction(s): Other (See Comments) Allergic to 2 meds - does not remember names or kind of meds or reactions  . Pork-Derived Products     Eating pork causes increased potassium  . Septra [Sulfamethoxazole-Trimethoprim]     Social History:   Social History   Socioeconomic History  . Marital status: Married    Spouse name: Not on  file  . Number of children: Not on file  . Years of education: Not on file  . Highest education level: Not on file  Occupational History  . Not on file  Tobacco Use  . Smoking status: Former Smoker  Substance and Sexual Activity  . Alcohol use: No  . Drug use: No  . Sexual activity: Not on file  Other Topics Concern  . Not on file  Social History Narrative   ** Merged History Encounter **       Social Determinants of Health   Financial Resource Strain:   . Difficulty of Paying Living Expenses: Not on file  Food Insecurity:   . Worried About Charity fundraiser in the Last Year: Not on file  . Ran Out of Food in the Last Year: Not on file  Transportation Needs:   . Lack of Transportation (Medical): Not on file  . Lack of Transportation (Non-Medical): Not on file  Physical Activity:   . Days of Exercise per Week: Not on file  . Minutes of Exercise per Session: Not on file  Stress:   . Feeling of Stress : Not on file  Social  Connections:   . Frequency of Communication with Friends and Family: Not on file  . Frequency of Social Gatherings with Friends and Family: Not on file  . Attends Religious Services: Not on file  . Active Member of Clubs or Organizations: Not on file  . Attends Archivist Meetings: Not on file  . Marital Status: Not on file  Intimate Partner Violence:   . Fear of Current or Ex-Partner: Not on file  . Emotionally Abused: Not on file  . Physically Abused: Not on file  . Sexually Abused: Not on file     Family History:  Family History  Family history unknown: Yes    ROS:  Review of Systems  Constitutional: Positive for malaise/fatigue. Negative for chills, diaphoresis, fever and weight loss.  HENT: Negative for congestion.   Eyes: Negative for discharge and redness.  Respiratory: Positive for cough, sputum production and shortness of breath. Negative for hemoptysis and wheezing.   Cardiovascular: Positive for chest pain. Negative for palpitations, orthopnea, claudication, leg swelling and PND.  Gastrointestinal: Negative for abdominal pain, blood in stool, heartburn, melena, nausea and vomiting.  Genitourinary: Negative for hematuria.  Musculoskeletal: Negative for falls and myalgias.  Skin: Negative for rash.  Neurological: Positive for weakness. Negative for dizziness, tingling, tremors, sensory change, speech change, focal weakness and loss of consciousness.  Endo/Heme/Allergies: Does not bruise/bleed easily.  Psychiatric/Behavioral: Negative for substance abuse. The patient is not nervous/anxious.   All other systems reviewed and are negative.     Physical Exam/Data:   Vitals:   09/03/20 0500 09/03/20 0630 09/03/20 0634 09/03/20 0700  BP: (!) 110/54 (!) 99/40 (!) 101/43 (!) 98/41  Pulse: (!) 57 60 62 (!) 58  Resp: 11 12 11 13   Temp:      TempSrc:      SpO2: 96% 99% 99% 98%  Weight:        Intake/Output Summary (Last 24 hours) at 09/03/2020 0830 Last data  filed at 09/03/2020 0522 Gross per 24 hour  Intake 490 ml  Output --  Net 490 ml   Filed Weights   09/02/20 1503 09/02/20 1624  Weight: 66.2 kg 66.2 kg   Body mass index is 22.2 kg/m.   Physical Exam: General: Well developed, well nourished, in no acute distress. Head: Normocephalic, atraumatic, sclera  non-icteric, no xanthomas, nares without discharge.  Neck: Negative for carotid bruits. JVD not elevated. Lungs: Diminished and coarse breath sounds bilaterally. Breathing is unlabored. Heart: RRR with S1 S2. No murmurs, rubs, or gallops appreciated. Abdomen: Soft, non-tender, non-distended with normoactive bowel sounds. No hepatomegaly. No rebound/guarding. No obvious abdominal masses. Msk:  Strength and tone appear normal for age. Extremities: No clubbing or cyanosis. No edema.  Status post bilateral BKA with no stump swelling. Neuro: Alert and oriented X 3. No facial asymmetry. No focal deficit. Moves all extremities spontaneously. Psych:  Responds to questions appropriately with a normal affect.   EKG:  The EKG was personally reviewed and demonstrates: NSR, 69 bpm, possible prior anterior infarct, nonspecific lateral ST-T changes which appear new when compared to prior tracing Telemetry:  Telemetry was personally reviewed and demonstrates: Sinus rhythm  Weights: Filed Weights   09/02/20 1503 09/02/20 1624  Weight: 66.2 kg 66.2 kg    Relevant CV Studies:  2D echo 09/03/2020: Pending  Laboratory Data:  Chemistry Recent Labs  Lab 09/02/20 1438  NA 143  K 5.1  CL 99  CO2 30  GLUCOSE 168*  BUN 51*  CREATININE 6.08*  CALCIUM 9.1  GFRNONAA 9*  GFRAA 10*  ANIONGAP 14    Recent Labs  Lab 09/02/20 1438  PROT 6.3*  ALBUMIN 3.2*  AST 179*  ALT 202*  ALKPHOS 206*  BILITOT 1.1   Hematology Recent Labs  Lab 09/02/20 1438  WBC 9.2  RBC 3.34*  HGB 10.7*  HCT 32.3*  MCV 96.7  MCH 32.0  MCHC 33.1  RDW 17.8*  PLT 134*   Cardiac EnzymesNo results for  input(s): TROPONINI in the last 168 hours. No results for input(s): TROPIPOC in the last 168 hours.  BNP Recent Labs  Lab 09/02/20 1438  BNP >4,500.0*    DDimer No results for input(s): DDIMER in the last 168 hours.  Radiology/Studies:  DG Chest Port 1 View  Result Date: 09/02/2020 IMPRESSION: 1. Patchy bilateral airspace opacities with dense consolidation at the left lung base. Findings are concerning for multifocal pneumonia. Recommend follow-up to resolution. 2. Possible layering small left pleural effusion. 3. Mild cardiomegaly. Electronically Signed   By: Margaretha Sheffield MD   On: 09/02/2020 14:46    Assessment and Plan:   1.  Elevated troponin/chest pain: -High-sensitivity troponin mildly elevated and flat trending likely in the setting of supply demand ischemia secondary to transient hypoxia with his supplemental oxygen being temporarily discontinued for unclear reasons, multifocal pneumonia, anemia of chronic disease, and end-stage renal disease -It is reasonable to continue heparin drip until echo has been resulted later today or for a maximum of 48 hours -If echo demonstrates preserved LVSF and normal wall motion with plan for Lexiscan MPI on 10/5 to evaluate for high risk ischemia -If this is unrevealing no further inpatient cardiac work-up is recommended and he should continue to undergo treatment for his multifocal pneumonia -Will make n.p.o. at midnight -ASA -LDL of 15 this admission with A1c pending for further risk ratification  2.  Chronic hypoxic respiratory failure: -Stable -Secondary to COPD Management per internal-  3.  Multifocal pneumonia: -Continue antibiotics per internal medicine  4.  ESRD: -HD per nephrology  5.  Transaminitis: -Of uncertain etiology with uncertain baseline -Does not appear to be related to congestive hepatopathy -Start statin when/if liver function improves  6.  HTN: -BP soft this morning -Not currently on  antihypertensives  7.  Anemia of chronic disease/thrombocytopenia: -Hgb and PLT  appear stable -Monitor on heparin drip    For questions or updates, please contact [place] Please consult [email or web address] for contact info under Cardiology/STEMI.   Signed, Christell Faith, PA-C [place] Pager: [phone] 09/03/2020, 8:30 AM

## 2020-09-03 NOTE — Progress Notes (Signed)
1       PROGRESS NOTE    Frank Proctor  SWN:[id number] DOB: May 13, 1951 DOA: 09/02/2020 PCP: [facility]   Brief Narrative:   Frank Proctor is a 69 y.o. male with medical history significant of chronic respiratory failure secondary to COPD, on 2 L at home, type 1 diabetes, ESRD on HD(MWF), bilateral BKA, recently started on antibiotics for pneumonia  admitted for chest pain/unstable angina. According to patient somebody turned off his oxygen at his facility and that is the reason he started some shortness of breath and chest pain.  Shortness of breath improved with restarting oxygen.,  He received one-time dose of sublingual nitroglycerin with some minor improvement in his chest pain.  He was describing chest pain as sharp, 7 out of 10 in intensity at this time, radiating to left shoulder.  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 Continue to have some cough, stating it is improving with antibiotics but could not remember the name. Last dialysis was on Friday.  He still makes urine and denies any urinary symptoms.  [facility]   ED Course: Hemodynamically stable, EKG without any significant ST changes, labs with hemoglobin of 10.7, platelets of 134, BUN and creatinine consistent with ESRD, AST of 179, ALT of 202, alkaline phosphatase of 206, troponin at 602.  Chest x-ray with patchy bilateral airspace opacities with dense consolidation at the left lung base.  COVID-19 PCR negative. Patient was started on heparin infusion.   Assessment & Plan:   Principal Problem:   Multifocal pneumonia Active Problems:   Demand ischemia ([place])   Pressure injury of skin  Elevated troponin/chest pain/NSTEMI.  Patient has chest pain with some typical components.  Troponin at 607 - EKG without any acute ST changes.  -Cardiology planning cardiac cath tomorrow -Echo shows EF of 25 to 30% with global hypokinesis, grade 2 diastolic dysfunction and severe  pulmonary hypertension. -Sublingual nitroglycerin as needed.   -Continue aspirin, heparin drip and statin -Starting low-dose Lipitor at 5 mg p.o. daily.  Will need liver function to be monitored very closely as he does have elevated LFTs.  Acute systolic heart failure BNP more than 4500.  Echo shows EF of 25 to 30% with global hypokinesis Clinically seems more fluid overloaded.  Dialysis should help.  Transaminitis.  Unknown baseline. -Continue to monitor.  With low-dose statin.  Recent pneumonia.  Chest x-ray with left-sided consolidation along with some patchy bilateral infiltrates.  COVID-19 negative.  He was recently started on some antibiotics at his facility, does not remember the name. Afebrile with no leukocytosis.  Procalcitonin elevated at 1.08. -Continue empiric ceftriaxone and Zithromax for now.  It is likely more from fluid overload than pneumonia.  We'll stop antibiotics as symptoms improve.  We'll trend procalcitonin to decide need for antibiotics.  Chronic respiratory failure secondary to COPD.  No sign of acute exacerbation.  Patient saturating well on his home oxygen of 2 L. -Continue with supplemental oxygen.  ESRD.  Nephrology was consulted. -Patient will continue his routine dialysis on Monday, Wednesday and Friday.  Type 1 diabetes.  -SSI.  Hypertension.  Blood pressure within goal.Continue to monitor  Body mass index is 22.2 kg/m.  Pressure Injury 09/03/20 Sacrum Medial Stage 1 -  Intact skin with non-blanchable redness of a localized area usually over a bony prominence. (Active)  09/03/20 0548  Location: Sacrum  Location Orientation: Medial  Staging: Stage 1 -  Intact skin with non-blanchable  redness of a localized area usually over a bony prominence.  Wound Description (Comments):   Present on Admission:      DVT prophylaxis:   On heparin drip Code Status: DNR Family Communication: Discussed with patient Disposition Plan: In next 2 to 3 days  depending on cardiac evaluation Status is: Inpatient  Remains inpatient appropriate because:Ongoing diagnostic testing needed not appropriate for outpatient work up   Dispo: The patient is from: Home              Anticipated d/c is to: Home              Anticipated d/c date is: 2 days              Patient currently is not medically stable to d/c.  Consultants:   Cardiology   Procedures: Echo, cath   Antimicrobials: IV Rocephin and Zithromax started on 10/3  Subjective: Still complains of chest pain 7 out of 10.  Agreeable for cardiac work-up if needed.  No other complaints.  Objective: Vitals:   09/03/20 0500 09/03/20 0630 09/03/20 0634 09/03/20 0700  BP: (!) 110/54 (!) 99/40 (!) 101/43 (!) 98/41  Pulse: (!) 57 60 62 (!) 58  Resp: 11 12 11 13   Temp:      TempSrc:      SpO2: 96% 99% 99% 98%  Weight:        Intake/Output Summary (Last 24 hours) at 09/03/2020 1416 Last data filed at 09/03/2020 0522 Gross per 24 hour  Intake 490 ml  Output --  Net 490 ml   Filed Weights   09/02/20 1503 09/02/20 1624  Weight: 66.2 kg 66.2 kg    Examination:  General exam: Appears calm and comfortable  Respiratory system: Decreased breath sound at bases.  Respiratory effort normal. Cardiovascular system: S1 & S2 heard, RRR. No JVD, murmurs, rubs, gallops or clicks. No pedal edema. Gastrointestinal system: Abdomen is nondistended, soft and nontender. No organomegaly or masses felt. Normal bowel sounds heard. Central nervous system: Alert and oriented. No focal neurological deficits. Extremities: Symmetric 5 x 5 power.  BKA bilaterally Skin: No rashes, lesions or ulcers Psychiatry: Judgement and insight appear normal. Mood & affect appropriate.  Dialysis access - working fistula in the right arm and nonfunctioning graft in the left arm   Data Reviewed: I have personally reviewed following labs and imaging studies  CBC: Recent Labs  Lab 09/02/20 1438  WBC 9.2  HGB 10.7*  HCT  32.3*  MCV 96.7  PLT 161*   Basic Metabolic Panel: Recent Labs  Lab 09/02/20 1438  NA 143  K 5.1  CL 99  CO2 30  GLUCOSE 168*  BUN 51*  CREATININE 6.08*  CALCIUM 9.1   GFR: CrCl cannot be calculated (Unknown ideal weight.). Liver Function Tests: Recent Labs  Lab 09/02/20 1438  AST 179*  ALT 202*  ALKPHOS 206*  BILITOT 1.1  PROT 6.3*  ALBUMIN 3.2*   Recent Labs  Lab 09/02/20 1438  LIPASE 37   No results for input(s): AMMONIA in the last 168 hours. Coagulation Profile: Recent Labs  Lab 09/02/20 1619  INR 1.2   Cardiac Enzymes: No results for input(s): CKTOTAL, CKMB, CKMBINDEX, TROPONINI in the last 168 hours. BNP (last 3 results) No results for input(s): PROBNP in the last 8760 hours. HbA1C: Recent Labs    09/03/20 0520  HGBA1C 7.2*   CBG: Recent Labs  Lab 09/02/20 2310 09/03/20 0904 09/03/20 1244  GLUCAP 90 118* 124*  Lipid Profile: Recent Labs    09/03/20 0520  CHOL 55  HDL 37*  LDLCALC 15  TRIG 17  CHOLHDL 1.5   Thyroid Function Tests: No results for input(s): TSH, T4TOTAL, FREET4, T3FREE, THYROIDAB in the last 72 hours. Anemia Panel: No results for input(s): VITAMINB12, FOLATE, FERRITIN, TIBC, IRON, RETICCTPCT in the last 72 hours. Sepsis Labs: Recent Labs  Lab 09/02/20 1619  PROCALCITON 1.08    Recent Results (from the past 240 hour(s))  Respiratory Panel by RT PCR (Flu A&B, Covid) - Nasopharyngeal Swab     Status: None   Collection Time: 09/02/20  3:06 PM   Specimen: Nasopharyngeal Swab  Result Value Ref Range Status   SARS Coronavirus 2 by RT PCR NEGATIVE NEGATIVE Final    Comment: (NOTE) SARS-CoV-2 target nucleic acids are NOT DETECTED.  The SARS-CoV-2 RNA is generally detectable in upper respiratoy specimens during the acute phase of infection. The lowest concentration of SARS-CoV-2 viral copies this assay can detect is 131 copies/mL. A negative result does not preclude SARS-Cov-2 infection and should not be used as  the sole basis for treatment or other patient management decisions. A negative result may occur with  improper specimen collection/handling, submission of specimen other than nasopharyngeal swab, presence of viral mutation(s) within the areas targeted by this assay, and inadequate number of viral copies (<131 copies/mL). A negative result must be combined with clinical observations, patient history, and epidemiological information. The expected result is Negative.  Fact Sheet for Patients:  PinkCheek.be  Fact Sheet for Healthcare Providers:  GravelBags.it  This test is no t yet approved or cleared by the [place] FDA and  has been authorized for detection and/or diagnosis of SARS-CoV-2 by FDA under an Emergency Use Authorization (EUA). This EUA will remain  in effect (meaning this test can be used) for the duration of the COVID-19 declaration under Section 564(b)(1) of the Act, 21 U.S.C. section 360bbb-3(b)(1), unless the authorization is terminated or revoked sooner.     Influenza A by PCR NEGATIVE NEGATIVE Final   Influenza B by PCR NEGATIVE NEGATIVE Final    Comment: (NOTE) The Xpert Xpress SARS-CoV-2/FLU/RSV assay is intended as an aid in  the diagnosis of influenza from Nasopharyngeal swab specimens and  should not be used as a sole basis for treatment. Nasal washings and  aspirates are unacceptable for Xpert Xpress SARS-CoV-2/FLU/RSV  testing.  Fact Sheet for Patients: PinkCheek.be  Fact Sheet for Healthcare Providers: GravelBags.it  This test is not yet approved or cleared by the [place] FDA and  has been authorized for detection and/or diagnosis of SARS-CoV-2 by  FDA under an Emergency Use Authorization (EUA). This EUA will remain  in effect (meaning this test can be used) for the duration of the  Covid-19 declaration under Section 564(b)(1)  of the Act, 21  U.S.C. section 360bbb-3(b)(1), unless the authorization is  terminated or revoked. Performed at [facility], [address] [place], [place] [address]          Radiology Studies: [facility] Chest [place] 1 View  Result Date: 09/02/2020 CLINICAL DATA:  Patient removed his oxygen. Reportedly recent pneumonia. EXAM: PORTABLE CHEST 1 VIEW COMPARISON:  01/11/2020 FINDINGS: Patchy bilateral airspace opacities with dense consolidation at the left lung base. Possible layering small left pleural effusion. No discernible pneumothorax. Mild enlargement the cardiac silhouette. Aortic atherosclerosis. Multiple clips projecting over the bilateral arms and axilla. IMPRESSION: 1. Patchy bilateral airspace opacities with dense consolidation at the left lung base. Findings  are concerning for multifocal pneumonia. Recommend follow-up to resolution. 2. Possible layering small left pleural effusion. 3. Mild cardiomegaly. Electronically Signed   By: Margaretha Sheffield MD   On: 09/02/2020 14:46   ECHOCARDIOGRAM COMPLETE  Result Date: 09/03/2020    ECHOCARDIOGRAM REPORT   Patient Name:   Frank Proctor Date of Exam: 09/03/2020 Medical Rec #:  [id number]        Height:       68.0 in Accession #:    [id number]       Weight:       146.0 lb Date of Birth:  1951-06-11       BSA:          1.788 m Patient Age:    26 years         BP:           98/41 mmHg Patient Gender: M                HR:           58 bpm. Exam Location:  [facility] Procedure: 2D Echo, Cardiac Doppler and Color Doppler Indications:     Acute coronary syndrome 124.9  History:         Patient has no prior history of Echocardiogram examinations.                  Risk Factors:Diabetes.  Sonographer:     Sherrie Sport RDCS (AE) Referring Phys:  [id number] [facility] AMIN Diagnosing Phys: Kathlyn Sacramento MD IMPRESSIONS  1. Left ventricular ejection fraction, by estimation, is 25 to 30%. The left ventricle has severely decreased function. The left ventricle  demonstrates global hypokinesis. There is mild left ventricular hypertrophy. Left ventricular diastolic parameters  are consistent with Grade II diastolic dysfunction (pseudonormalization).  2. Right ventricular systolic function is normal. The right ventricular size is normal. There is severely elevated pulmonary artery systolic pressure. The estimated right ventricular systolic pressure is 12.4 mmHg.  3. Left atrial size was moderately dilated.  4. Right atrial size was moderately dilated.  5. Moderate pleural effusion in the left lateral region.  6. The mitral valve is normal in structure. Mild mitral valve regurgitation. No evidence of mitral stenosis. Moderate mitral annular calcification.  7. Tricuspid valve regurgitation is moderate.  8. The aortic valve is normal in structure. Aortic valve regurgitation is not visualized. Mild aortic valve sclerosis is present, with no evidence of aortic valve stenosis.  9. Moderately dilated pulmonary artery. 10. The inferior vena cava is dilated in size with >50% respiratory variability, suggesting right atrial pressure of 8 mmHg. FINDINGS  Left Ventricle: Left ventricular ejection fraction, by estimation, is 25 to 30%. The left ventricle has severely decreased function. The left ventricle demonstrates global hypokinesis. The left ventricular internal cavity size was normal in size. There is mild left ventricular hypertrophy. Left ventricular diastolic parameters are consistent with Grade II diastolic dysfunction (pseudonormalization). Right Ventricle: The right ventricular size is normal. No increase in right ventricular wall thickness. Right ventricular systolic function is normal. There is severely elevated pulmonary artery systolic pressure. The tricuspid regurgitant velocity is 3.83 m/s, and with an assumed right atrial pressure of 8 mmHg, the estimated right ventricular systolic pressure is 58.0 mmHg. Left Atrium: Left atrial size was moderately dilated. Right  Atrium: Right atrial size was moderately dilated. Pericardium: There is no evidence of pericardial effusion. Mitral Valve: The mitral valve is normal in structure. Moderate mitral annular calcification. Mild mitral valve regurgitation.  No evidence of mitral valve stenosis. Tricuspid Valve: The tricuspid valve is normal in structure. Tricuspid valve regurgitation is moderate . No evidence of tricuspid stenosis. Aortic Valve: The aortic valve is normal in structure. Aortic valve regurgitation is not visualized. Mild aortic valve sclerosis is present, with no evidence of aortic valve stenosis. Aortic valve mean gradient measures 3.0 mmHg. Aortic valve peak gradient measures 5.4 mmHg. Aortic valve area, by VTI measures 1.83 cm. Pulmonic Valve: The pulmonic valve was normal in structure. Pulmonic valve regurgitation is mild. No evidence of pulmonic stenosis. Aorta: The aortic root is normal in size and structure. Pulmonary Artery: The pulmonary artery is moderately dilated. Venous: The inferior vena cava is dilated in size with greater than 50% respiratory variability, suggesting right atrial pressure of 8 mmHg. IAS/Shunts: No atrial level shunt detected by color flow Doppler. Additional Comments: There is a moderate pleural effusion in the left lateral region.  LEFT VENTRICLE PLAX 2D LVIDd:         5.43 cm     Diastology LVIDs:         4.82 cm     LV e' medial:    5.66 cm/s LV PW:         1.15 cm     LV E/e' medial:  18.9 LV IVS:        1.13 cm     LV e' lateral:   7.40 cm/s LVOT diam:     2.00 cm     LV E/e' lateral: 14.5 LV SV:         50 LV SV Index:   28 LVOT Area:     3.14 cm  LV Volumes (MOD) LV vol d, MOD A2C: 80.4 ml LV vol d, MOD A4C: 67.1 ml LV vol s, MOD A2C: 56.6 ml LV vol s, MOD A4C: 50.9 ml LV SV MOD A2C:     23.8 ml LV SV MOD A4C:     67.1 ml LV SV MOD BP:      22.9 ml RIGHT VENTRICLE RV Basal diam:  4.57 cm RV S prime:     9.46 cm/s TAPSE (M-mode): 4.3 cm LEFT ATRIUM              Index       RIGHT  ATRIUM           Index LA diam:        4.10 cm  2.29 cm/m  RA Area:     32.70 cm LA Vol (A2C):   122.0 ml 68.24 ml/m RA Volume:   121.00 ml 67.68 ml/m LA Vol (A4C):   45.9 ml  25.67 ml/m LA Biplane Vol: 78.7 ml  44.02 ml/m  AORTIC VALVE                   PULMONIC VALVE AV Area (Vmax):    1.56 cm    PV Vmax:        0.54 m/s AV Area (Vmean):   1.70 cm    PV Peak grad:   1.2 mmHg AV Area (VTI):     1.83 cm    RVOT Peak grad: 3 mmHg AV Vmax:           116.33 cm/s AV Vmean:          78.200 cm/s AV VTI:            0.273 m AV Peak Grad:      5.4 mmHg AV Mean Grad:  3.0 mmHg LVOT Vmax:         57.60 cm/s LVOT Vmean:        42.200 cm/s LVOT VTI:          0.159 m LVOT/AV VTI ratio: 0.58  AORTA Ao Root diam: 3.00 cm MITRAL VALVE                TRICUSPID VALVE MV Area (PHT): 3.53 cm     TR Peak grad:   58.7 mmHg MV Decel Time: 215 msec     TR Vmax:        383.00 cm/s MV E velocity: 107.00 cm/s MV A velocity: 98.00 cm/s   SHUNTS MV E/A ratio:  1.09         Systemic VTI:  0.16 m                             Systemic Diam: 2.00 cm Kathlyn Sacramento MD Electronically signed by Kathlyn Sacramento MD Signature Date/Time: 09/03/2020/10:07:47 AM    Final         Scheduled Meds: . aspirin EC  81 mg Oral Daily  . atorvastatin  5 mg Oral Daily  . [START ON 09/04/2020] Chlorhexidine Gluconate Cloth  6 each Topical Q0600  . ferrous sulfate  325 mg Oral BID WC  . gabapentin  300 mg Oral QPM  . insulin aspart  0-5 Units Subcutaneous QHS  . insulin aspart  0-6 Units Subcutaneous TID WC  . lidocaine  1 patch Transdermal Daily  . sevelamer carbonate  2,400 mg Oral TID WC   Continuous Infusions: . sodium chloride    . sodium chloride    . azithromycin Stopped (09/03/20 0108)  . cefTRIAXone (ROCEPHIN)  IV Stopped (09/02/20 1805)  . heparin 950 Units/hr (09/03/20 0342)     LOS: 1 day    Time spent: 35 minutes    Skyelynn Rambeau Manuella Ghazi, MD [facility] Pager [phone]  If 7PM-7AM, please contact  night-coverage [email or web address] Password [email or web address] 09/03/2020, 2:16 PM

## 2020-09-03 NOTE — H&P (View-Only) (Signed)
Cardiology Consultation:   Patient ID: Frank Proctor; [id number]; [birth year]   Admit date: 09/02/2020 Date of Consult: 09/03/2020  Primary Care Provider: [facility] [place] Primary Cardiologist: New to [facility] - consult by Fletcher Anon Primary Electrophysiologist:  None   Patient Profile:   Frank Proctor is a 69 y.o. male with a hx of chronic hypoxic respiratory failure on supplemental oxygen via nasal cannula 2 L for an unknown duration secondary to COPD, type 2 diabetes, anemia of chronic disease, status post bilateral BKA secondary to what appears to be nonhealing ulcer/osteomyelitis with the right lower extremity being amputated in 2005 or 2006 and the left lower extremity being amputated "a while ago", ESRD on HD Monday, Wednesday, and Friday for uncertain duration who is followed by the [facility] health system who is being seen today for the evaluation of chest pain in the setting of multifocal pneumonia at the request of Dr. Reesa Chew.  History of Present Illness:   [name] has no previously known cardiac history.  He has been on supplemental oxygen for months to years, he has not certain exactly how long he has been on oxygen therapy.  Nonetheless, he was recently treated as an outpatient for pneumonia.  Indicates on 10/3 someone from his living facility came in and turned off his supplemental oxygen.  He is unclear why they did this.  His nurse noted altered mental status and realized his supplemental oxygen was turned off.  Upon turning his back on he complained of substernal chest discomfort rated 8 out of 10 along with associated shortness of breath.  He denied any diaphoresis, nausea, vomiting, or palpitations.  EMS was contacted with the patient given [place] nitroglycerin x1 with slight improvement in chest discomfort.  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  Upon the  patient's arrival to [facility] they were found to have BP in the 509T to 267T systolic, more recently into the 24P systolic, HR stable, temp afebrile, oxygen saturation 98% on supplemental oxygen at 2 L via nasal cannula. EKG showed NSR, 69 bpm, possible prior anterior infarct, nonspecific lateral ST-T changes which appear new when compared to prior tracing, CXR showed patchy bilateral airspace opacities with dense consolidation at the left base concerning for multifocal pneumonia with possible layering of a small left pleural effusion and mild cardiomegaly. Labs showed an initial high-sensitivity troponin 602 with a delta troponin of 577, PCT 1.08, Covid negative, BNP greater than 4500, lipase normal, WBC 9.2, Hgb 10.7, PLT 134, potassium 5.1, AST 179, ALT 202, albumin 3.2, LDL 15.  In the ED he was given Mylanta, aspirin, azithromycin, Rocephin, and started on a heparin drip.  Upon admission cardiology was asked to evaluate his chest pain.  Currently, he continues to have a cough productive of green sputum.  He also continues to note 7 out of 10 chest pain though is resting comfortably in bed and was sleeping upon my arrival.  Past Medical History:  Diagnosis Date  . Anemia   . Diabetes mellitus without complication ([place])   . Hyperparathyroidism ([place])   . Renal disorder     Past Surgical History:  Procedure Laterality Date  . BELOW KNEE LEG AMPUTATION    . CATARACT EXTRACTION W/ INTRAOCULAR LENS  IMPLANT, BILATERAL    . CHOLECYSTECTOMY       Home Meds: Prior to Admission medications   Medication Sig Start Date  End Date Taking? Authorizing Provider  acetaminophen (TYLENOL) 325 MG tablet Take 975 mg by mouth every 8 (eight) hours as needed for mild pain or moderate pain.   Yes [provider]  ascorbic acid (VITAMIN C) 500 MG tablet Take 500 mg by mouth 3 (three) times daily.   Yes [provider]  aspirin EC 81 MG tablet Take 81 mg by mouth daily.   Yes [provider]    atorvastatin (LIPITOR) 10 MG tablet Take 5 mg by mouth daily.   Yes [provider]  B Complex-C-Folic Acid (VIRT-CAPS) 1 MG CAPS Take 1 capsule by mouth daily.   Yes [provider]  dextrose (GLUTOSE) 40 % GEL Take 1 Tube by mouth once as needed for low blood sugar. (glucose reading <80)   Yes [provider]  ferrous sulfate 325 (65 FE) MG tablet Take 325 mg by mouth 2 (two) times daily with a meal.   Yes [provider]  gabapentin (NEURONTIN) 300 MG capsule Take 300 mg by mouth every evening.   Yes [provider]  glucagon (GLUCAGEN HYPOKIT) 1 MG SOLR injection Inject 1 mg into the muscle once as needed for low blood sugar. (if patient is unresponsive)   Yes [provider]  hydroxypropyl methylcellulose / hypromellose (ISOPTO TEARS / GONIOVISC) 2.5 % ophthalmic solution Place 2 drops into both eyes 4 (four) times daily as needed for dry eyes.   Yes [provider]  insulin aspart (NOVOLOG) 100 UNIT/ML injection Inject 0-12 Units into the skin See admin instructions. Inject under the skin twice daily according to sliding scale: 200-249: 2u 250-299: 4u 300-349: 6u 350-399: 8u 400-449: 10u 450-499: 12u 500 or greater: contact physician   Yes [provider]  Lidocaine 4 % PTCH Apply 1 patch topically daily. (remove after 12 hours)   Yes [provider]  losartan (COZAAR) 25 MG tablet Take 25 mg by mouth daily.   Yes [provider]  menthol-cetylpyridinium (CEPACOL) 3 MG lozenge Take 1 lozenge by mouth every 2 (two) hours as needed for sore throat.   Yes [provider]  midodrine (PROAMATINE) 10 MG tablet Take 10 mg by mouth every Monday, Wednesday, and Friday. (take 30 minutes prior to dialysis)   Yes [provider]  sevelamer carbonate (RENVELA) 800 MG tablet Take 2,400 mg by mouth 3 (three) times daily with meals.   Yes [provider]  zinc oxide 20 % ointment Apply 1  application topically as directed. (apply to buttocks every shift)   Yes [provider]    Inpatient Medications: Scheduled Meds: . aspirin EC  81 mg Oral Daily  . insulin aspart  0-5 Units Subcutaneous QHS  . insulin aspart  0-6 Units Subcutaneous TID WC   Continuous Infusions: . azithromycin Stopped (09/03/20 0108)  . cefTRIAXone (ROCEPHIN)  IV Stopped (09/02/20 1805)  . heparin 950 Units/hr (09/03/20 0342)   PRN Meds: acetaminophen, nitroGLYCERIN, ondansetron (ZOFRAN) IV  Allergies:   Allergies  Allergen Reactions  . Lisinopril   . Other     Pt reports has 2 allergies but not sure what Other reaction(s): Other (See Comments) Allergic to 2 meds - does not remember names or kind of meds or reactions  . Pork-Derived Products     Eating pork causes increased potassium  . Septra [Sulfamethoxazole-Trimethoprim]     Social History:   Social History   Socioeconomic History  . Marital status: Married    Spouse name: Not on  file  . Number of children: Not on file  . Years of education: Not on file  . Highest education level: Not on file  Occupational History  . Not on file  Tobacco Use  . Smoking status: Former Smoker  Substance and Sexual Activity  . Alcohol use: No  . Drug use: No  . Sexual activity: Not on file  Other Topics Concern  . Not on file  Social History Narrative   ** Merged History Encounter **       Social Determinants of Health   Financial Resource Strain:   . Difficulty of Paying Living Expenses: Not on file  Food Insecurity:   . Worried About Charity fundraiser in the Last Year: Not on file  . Ran Out of Food in the Last Year: Not on file  Transportation Needs:   . Lack of Transportation (Medical): Not on file  . Lack of Transportation (Non-Medical): Not on file  Physical Activity:   . Days of Exercise per Week: Not on file  . Minutes of Exercise per Session: Not on file  Stress:   . Feeling of Stress : Not on file  Social  Connections:   . Frequency of Communication with Friends and Family: Not on file  . Frequency of Social Gatherings with Friends and Family: Not on file  . Attends Religious Services: Not on file  . Active Member of Clubs or Organizations: Not on file  . Attends Archivist Meetings: Not on file  . Marital Status: Not on file  Intimate Partner Violence:   . Fear of Current or Ex-Partner: Not on file  . Emotionally Abused: Not on file  . Physically Abused: Not on file  . Sexually Abused: Not on file     Family History:  Family History  Family history unknown: Yes    ROS:  Review of Systems  Constitutional: Positive for malaise/fatigue. Negative for chills, diaphoresis, fever and weight loss.  HENT: Negative for congestion.   Eyes: Negative for discharge and redness.  Respiratory: Positive for cough, sputum production and shortness of breath. Negative for hemoptysis and wheezing.   Cardiovascular: Positive for chest pain. Negative for palpitations, orthopnea, claudication, leg swelling and PND.  Gastrointestinal: Negative for abdominal pain, blood in stool, heartburn, melena, nausea and vomiting.  Genitourinary: Negative for hematuria.  Musculoskeletal: Negative for falls and myalgias.  Skin: Negative for rash.  Neurological: Positive for weakness. Negative for dizziness, tingling, tremors, sensory change, speech change, focal weakness and loss of consciousness.  Endo/Heme/Allergies: Does not bruise/bleed easily.  Psychiatric/Behavioral: Negative for substance abuse. The patient is not nervous/anxious.   All other systems reviewed and are negative.     Physical Exam/Data:   Vitals:   09/03/20 0500 09/03/20 0630 09/03/20 0634 09/03/20 0700  BP: (!) 110/54 (!) 99/40 (!) 101/43 (!) 98/41  Pulse: (!) 57 60 62 (!) 58  Resp: 11 12 11 13   Temp:      TempSrc:      SpO2: 96% 99% 99% 98%  Weight:        Intake/Output Summary (Last 24 hours) at 09/03/2020 0830 Last data  filed at 09/03/2020 0522 Gross per 24 hour  Intake 490 ml  Output --  Net 490 ml   Filed Weights   09/02/20 1503 09/02/20 1624  Weight: 66.2 kg 66.2 kg   Body mass index is 22.2 kg/m.   Physical Exam: General: Well developed, well nourished, in no acute distress. Head: Normocephalic, atraumatic, sclera  non-icteric, no xanthomas, nares without discharge.  Neck: Negative for carotid bruits. JVD not elevated. Lungs: Diminished and coarse breath sounds bilaterally. Breathing is unlabored. Heart: RRR with S1 S2. No murmurs, rubs, or gallops appreciated. Abdomen: Soft, non-tender, non-distended with normoactive bowel sounds. No hepatomegaly. No rebound/guarding. No obvious abdominal masses. Msk:  Strength and tone appear normal for age. Extremities: No clubbing or cyanosis. No edema.  Status post bilateral BKA with no stump swelling. Neuro: Alert and oriented X 3. No facial asymmetry. No focal deficit. Moves all extremities spontaneously. Psych:  Responds to questions appropriately with a normal affect.   EKG:  The EKG was personally reviewed and demonstrates: NSR, 69 bpm, possible prior anterior infarct, nonspecific lateral ST-T changes which appear new when compared to prior tracing Telemetry:  Telemetry was personally reviewed and demonstrates: Sinus rhythm  Weights: Filed Weights   09/02/20 1503 09/02/20 1624  Weight: 66.2 kg 66.2 kg    Relevant CV Studies:  2D echo 09/03/2020: Pending  Laboratory Data:  Chemistry Recent Labs  Lab 09/02/20 1438  NA 143  K 5.1  CL 99  CO2 30  GLUCOSE 168*  BUN 51*  CREATININE 6.08*  CALCIUM 9.1  GFRNONAA 9*  GFRAA 10*  ANIONGAP 14    Recent Labs  Lab 09/02/20 1438  PROT 6.3*  ALBUMIN 3.2*  AST 179*  ALT 202*  ALKPHOS 206*  BILITOT 1.1   Hematology Recent Labs  Lab 09/02/20 1438  WBC 9.2  RBC 3.34*  HGB 10.7*  HCT 32.3*  MCV 96.7  MCH 32.0  MCHC 33.1  RDW 17.8*  PLT 134*   Cardiac EnzymesNo results for  input(s): TROPONINI in the last 168 hours. No results for input(s): TROPIPOC in the last 168 hours.  BNP Recent Labs  Lab 09/02/20 1438  BNP >4,500.0*    DDimer No results for input(s): DDIMER in the last 168 hours.  Radiology/Studies:  DG Chest Port 1 View  Result Date: 09/02/2020 IMPRESSION: 1. Patchy bilateral airspace opacities with dense consolidation at the left lung base. Findings are concerning for multifocal pneumonia. Recommend follow-up to resolution. 2. Possible layering small left pleural effusion. 3. Mild cardiomegaly. Electronically Signed   By: Margaretha Sheffield MD   On: 09/02/2020 14:46    Assessment and Plan:   1.  Elevated troponin/chest pain: -High-sensitivity troponin mildly elevated and flat trending likely in the setting of supply demand ischemia secondary to transient hypoxia with his supplemental oxygen being temporarily discontinued for unclear reasons, multifocal pneumonia, anemia of chronic disease, and end-stage renal disease -It is reasonable to continue heparin drip until echo has been resulted later today or for a maximum of 48 hours -If echo demonstrates preserved LVSF and normal wall motion with plan for Lexiscan MPI on 10/5 to evaluate for high risk ischemia -If this is unrevealing no further inpatient cardiac work-up is recommended and he should continue to undergo treatment for his multifocal pneumonia -Will make n.p.o. at midnight -ASA -LDL of 15 this admission with A1c pending for further risk ratification  2.  Chronic hypoxic respiratory failure: -Stable -Secondary to COPD Management per internal-  3.  Multifocal pneumonia: -Continue antibiotics per internal medicine  4.  ESRD: -HD per nephrology  5.  Transaminitis: -Of uncertain etiology with uncertain baseline -Does not appear to be related to congestive hepatopathy -Start statin when/if liver function improves  6.  HTN: -BP soft this morning -Not currently on  antihypertensives  7.  Anemia of chronic disease/thrombocytopenia: -Hgb and PLT  appear stable -Monitor on heparin drip    For questions or updates, please contact [place] Please consult [email or web address] for contact info under Cardiology/STEMI.   Signed, Christell Faith, PA-C [place] Pager: [phone] 09/03/2020, 8:30 AM

## 2020-09-03 NOTE — ED Notes (Signed)
Per Dr. Rogue Jury and Dr. Inocente Salles patient is able to come off heparin drip so he can be transported to dialysis

## 2020-09-04 ENCOUNTER — Encounter
Admission: EM | Disposition: A | Discharge status: Rehabilitation Facility | Payer: Self-pay | Source: Skilled Nursing Facility | Attending: Internal Medicine

## 2020-09-04 ENCOUNTER — Other Ambulatory Visit: Payer: Non-veteran care

## 2020-09-04 DIAGNOSIS — I5021 Acute systolic (congestive) heart failure: Secondary | ICD-10-CM

## 2020-09-04 DIAGNOSIS — I2510 Atherosclerotic heart disease of native coronary artery without angina pectoris: Secondary | ICD-10-CM

## 2020-09-04 DIAGNOSIS — I248 Other forms of acute ischemic heart disease: Secondary | ICD-10-CM

## 2020-09-04 DIAGNOSIS — I214 Non-ST elevation (NSTEMI) myocardial infarction: Secondary | ICD-10-CM

## 2020-09-04 DIAGNOSIS — I200 Unstable angina: Secondary | ICD-10-CM

## 2020-09-04 HISTORY — PX: RIGHT/LEFT HEART CATH AND CORONARY ANGIOGRAPHY: CATH118266

## 2020-09-04 HISTORY — PX: CORONARY STENT INTERVENTION: CATH118234

## 2020-09-04 LAB — CBC
HCT: 31.2 % — ABNORMAL LOW (ref 39.0–52.0)
Hemoglobin: 10.6 g/dL — ABNORMAL LOW (ref 13.0–17.0)
MCH: 32.3 pg (ref 26.0–34.0)
MCHC: 34 g/dL (ref 30.0–36.0)
MCV: 95.1 fL (ref 80.0–100.0)
Platelets: 123 10*3/uL — ABNORMAL LOW (ref 150–400)
RBC: 3.28 MIL/uL — ABNORMAL LOW (ref 4.22–5.81)
RDW: 17.5 % — ABNORMAL HIGH (ref 11.5–15.5)
WBC: 7.6 10*3/uL (ref 4.0–10.5)
nRBC: 0 % (ref 0.0–0.2)

## 2020-09-04 LAB — COMPREHENSIVE METABOLIC PANEL
ALT: 138 U/L — ABNORMAL HIGH (ref 0–44)
AST: 74 U/L — ABNORMAL HIGH (ref 15–41)
Albumin: 3 g/dL — ABNORMAL LOW (ref 3.5–5.0)
Alkaline Phosphatase: 202 U/L — ABNORMAL HIGH (ref 38–126)
Anion gap: 14 (ref 5–15)
BUN: 55 mg/dL — ABNORMAL HIGH (ref 8–23)
CO2: 26 mmol/L (ref 22–32)
Calcium: 8.8 mg/dL — ABNORMAL LOW (ref 8.9–10.3)
Chloride: 97 mmol/L — ABNORMAL LOW (ref 98–111)
Creatinine, Ser: 6 mg/dL — ABNORMAL HIGH (ref 0.61–1.24)
GFR calc Af Amer: 10 mL/min — ABNORMAL LOW (ref >60)
GFR calc non Af Amer: 9 mL/min — ABNORMAL LOW (ref >60)
Glucose, Bld: 162 mg/dL — ABNORMAL HIGH (ref 70–99)
Potassium: 5.7 mmol/L — ABNORMAL HIGH (ref 3.5–5.1)
Sodium: 137 mmol/L (ref 135–145)
Total Bilirubin: 0.9 mg/dL (ref 0.3–1.2)
Total Protein: 5.8 g/dL — ABNORMAL LOW (ref 6.5–8.1)

## 2020-09-04 LAB — HEPARIN LEVEL (UNFRACTIONATED): Heparin Unfractionated: <0.1 IU/mL — ABNORMAL LOW (ref 0.30–0.70)

## 2020-09-04 LAB — HEPATITIS PANEL, ACUTE
HCV Ab: NONREACTIVE
HCV Ab: NONREACTIVE
Hep A IgM: NONREACTIVE
Hep A IgM: NONREACTIVE
Hep B C IgM: NONREACTIVE
Hep B C IgM: NONREACTIVE
Hepatitis B Surface Ag: NONREACTIVE
Hepatitis B Surface Ag: NONREACTIVE

## 2020-09-04 LAB — POCT ACTIVATED CLOTTING TIME
Activated Clotting Time: 230 seconds
Activated Clotting Time: 252 seconds

## 2020-09-04 LAB — GLUCOSE, CAPILLARY
Glucose-Capillary: 114 mg/dL — ABNORMAL HIGH (ref 70–99)
Glucose-Capillary: 100 mg/dL — ABNORMAL HIGH (ref 70–99)
Glucose-Capillary: 84 mg/dL (ref 70–99)
Glucose-Capillary: 115 mg/dL — ABNORMAL HIGH (ref 70–99)
Glucose-Capillary: 115 mg/dL — ABNORMAL HIGH (ref 70–99)

## 2020-09-04 LAB — PROCALCITONIN: Procalcitonin: 0.82 ng/mL

## 2020-09-04 SURGERY — RIGHT/LEFT HEART CATH AND CORONARY ANGIOGRAPHY
Anesthesia: Moderate Sedation

## 2020-09-04 MED ORDER — HYDRALAZINE HCL 20 MG/ML IJ SOLN: 10.0000 mg | INTRAMUSCULAR | Status: AC | PRN

## 2020-09-04 MED ORDER — SODIUM CHLORIDE 0.9 % IV SOLN: 250.0000 mL | INTRAVENOUS | Status: DC | PRN

## 2020-09-04 MED ORDER — HEPARIN (PORCINE) IN NACL 1000-0.9 UT/500ML-% IV SOLN
INTRAVENOUS | Status: DC | PRN
  Administered 2020-09-04 (×2): 500 mL

## 2020-09-04 MED ORDER — MIDAZOLAM HCL 2 MG/2ML IJ SOLN
INTRAMUSCULAR | Status: AC
  Filled 2020-09-04: qty 2

## 2020-09-04 MED ORDER — CLOPIDOGREL BISULFATE 75 MG PO TABS
75.0000 mg | ORAL_TABLET | Freq: Every day | ORAL | Status: DC
  Administered 2020-09-06: 75 mg via ORAL
  Filled 2020-09-04: qty 1

## 2020-09-04 MED ORDER — HEPARIN SODIUM (PORCINE) 1000 UNIT/ML IJ SOLN
INTRAMUSCULAR | Status: DC | PRN
  Administered 2020-09-04: 2000 [IU] via INTRAVENOUS
  Administered 2020-09-04: 6000 [IU] via INTRAVENOUS
  Administered 2020-09-04: 3000 [IU] via INTRAVENOUS

## 2020-09-04 MED ORDER — CLOPIDOGREL BISULFATE 75 MG PO TABS
ORAL_TABLET | ORAL | Status: AC
  Filled 2020-09-04: qty 8

## 2020-09-04 MED ORDER — SODIUM CHLORIDE 0.9% FLUSH: 3.0000 mL | INTRAVENOUS | Status: DC | PRN

## 2020-09-04 MED ORDER — SODIUM CHLORIDE 0.9 % IV SOLN: INTRAVENOUS | Status: DC

## 2020-09-04 MED ORDER — MIDAZOLAM HCL 2 MG/2ML IJ SOLN
INTRAMUSCULAR | Status: DC | PRN
  Administered 2020-09-04 (×2): 0.5 mg via INTRAVENOUS

## 2020-09-04 MED ORDER — NITROGLYCERIN 1 MG/10 ML FOR IR/CATH LAB
INTRA_ARTERIAL | Status: AC
  Filled 2020-09-04: qty 10

## 2020-09-04 MED ORDER — SODIUM CHLORIDE 0.9% FLUSH
3.0000 mL | Freq: Two times a day (BID) | INTRAVENOUS | Status: DC
  Administered 2020-09-04 – 2020-09-06 (×5): 3 mL via INTRAVENOUS

## 2020-09-04 MED ORDER — SODIUM CHLORIDE 0.9% FLUSH
3.0000 mL | Freq: Two times a day (BID) | INTRAVENOUS | Status: DC
  Administered 2020-09-04: 3 mL via INTRAVENOUS

## 2020-09-04 MED ORDER — FENTANYL CITRATE (PF) 100 MCG/2ML IJ SOLN
INTRAMUSCULAR | Status: AC
  Filled 2020-09-04: qty 2

## 2020-09-04 MED ORDER — HEPARIN SODIUM (PORCINE) 1000 UNIT/ML IJ SOLN
INTRAMUSCULAR | Status: AC
  Filled 2020-09-04: qty 1

## 2020-09-04 MED ORDER — CLOPIDOGREL BISULFATE 75 MG PO TABS
ORAL_TABLET | ORAL | Status: DC | PRN
  Administered 2020-09-04: 600 mg via ORAL

## 2020-09-04 MED ORDER — HEPARIN BOLUS VIA INFUSION
2000.0000 [IU] | Freq: Once | INTRAVENOUS | Status: AC
  Administered 2020-09-04: 2000 [IU] via INTRAVENOUS
  Filled 2020-09-04: qty 2000

## 2020-09-04 MED ORDER — HEPARIN (PORCINE) IN NACL 1000-0.9 UT/500ML-% IV SOLN
INTRAVENOUS | Status: AC
  Filled 2020-09-04: qty 1000

## 2020-09-04 MED ORDER — IOHEXOL 300 MG/ML  SOLN
INTRAMUSCULAR | Status: DC | PRN
  Administered 2020-09-04: 110 mL

## 2020-09-04 MED ORDER — FENTANYL CITRATE (PF) 100 MCG/2ML IJ SOLN
INTRAMUSCULAR | Status: DC | PRN
Start: 2020-09-04 — End: 2020-09-04
  Administered 2020-09-04 (×2): 12.5 ug via INTRAVENOUS

## 2020-09-04 MED ORDER — HEPARIN SODIUM (PORCINE) 5000 UNIT/ML IJ SOLN
5000.0000 [IU] | Freq: Three times a day (TID) | INTRAMUSCULAR | Status: DC
  Administered 2020-09-05 – 2020-09-06 (×2): 5000 [IU] via SUBCUTANEOUS
  Filled 2020-09-04 (×3): qty 1

## 2020-09-04 SURGICAL SUPPLY — 19 items
BALLN TREK RX 2.5X12 (BALLOONS) ×3
BALLN ~~LOC~~ TREK RX 3.25X12 (BALLOONS) ×3
BALLOON TREK RX 2.5X12 (BALLOONS) IMPLANT
BALLOON ~~LOC~~ TREK RX 3.25X12 (BALLOONS) IMPLANT
CANNULA 5F STIFF (CANNULA) ×2 IMPLANT
CATH INFINITI 5FR JL4 (CATHETERS) ×2 IMPLANT
CATH INFINITI JR4 5F (CATHETERS) ×2 IMPLANT
CATH LAUNCHER 6FR JR4 (CATHETERS) ×2 IMPLANT
CATH SWAN GANZ 7F STRAIGHT (CATHETERS) ×2 IMPLANT
DEVICE CLOSURE MYNXGRIP 6/7F (Vascular Products) ×2 IMPLANT
KIT ENCORE 26 ADVANTAGE (KITS) ×2 IMPLANT
KIT MANI 3VAL PERCEP (MISCELLANEOUS) ×3 IMPLANT
PACK CARDIAC CATH (CUSTOM PROCEDURE TRAY) ×3 IMPLANT
SHEATH AVANTI 5FR X 11CM (SHEATH) ×2 IMPLANT
SHEATH AVANTI 6FR X 11CM (SHEATH) ×2 IMPLANT
SHEATH AVANTI 7FRX11 (SHEATH) ×2 IMPLANT
STENT RESOLUTE ONYX 3.0X22 (Permanent Stent) ×2 IMPLANT
WIRE GUIDERIGHT .035X150 (WIRE) ×3 IMPLANT
WIRE RUNTHROUGH .014X180CM (WIRE) ×2 IMPLANT

## 2020-09-04 NOTE — Consult Note (Signed)
ANTICOAGULATION CONSULT NOTE - Initial Consult  Pharmacy Consult for Heparin Drip Indication: chest pain/ACS/STEMI   Allergies  Allergen Reactions   Lisinopril    Other     Pt reports has 2 allergies but not sure what Other reaction(s): Other (See Comments) Allergic to 2 meds - does not remember names or kind of meds or reactions   Pork-Derived Products     Eating pork causes increased potassium   Septra [Sulfamethoxazole-Trimethoprim]     Patient Measurements: Weight: 66.9 kg (147 lb 6.4 oz) Heparin Dosing Weight: 66.2kg  Vital Signs: Temp: 97.7 F (36.5 C) (10/05 0437) Temp Source: Oral (10/05 0437) BP: 106/56 (10/05 0437) Pulse Rate: 59 (10/05 0437)  Labs: Recent Labs    09/02/20 1438 09/02/20 1619 09/03/20 0116 09/04/20 0329  HGB 10.7*  --   --  10.6*  HCT 32.3*  --   --  31.2*  PLT 134*  --   --  123*  APTT  --  35  --   --   LABPROT  --  14.5  --   --   INR  --  1.2  --   --   HEPARINUNFRC  --   --  0.28* <0.10*  CREATININE 6.08*  --   --  6.00*  TROPONINIHS 602* 577*  --   --     CrCl cannot be calculated (Unknown ideal weight.).   Medical History: Past Medical History:  Diagnosis Date   Anemia of chronic disease    Chronic respiratory failure with hypoxia (H[facility])    COPD (chronic obstructive pulmonary disease) (H[facility])    Diabetes mellitus without complication (H[facility])    End stage renal disease on dialysis ([place])    Hyperparathyroidism (HCC)    Thrombocytopenia (H[facility])     Medications:  No PTA anticoagulation of record  Assessment: 69 yo male with chest pain and elevated troponins (602).     Pharmacy has been consulted to initiate and monitor a heparin drip.  Goal of Therapy:  Heparin level 0.3-0.7 units/ml Monitor platelets by anticoagulation protocol: Yes   Plan:   Will obtain baseline APTT and INR values.  Will give 3000 unit heparin bolus, followed by 750 units/hr  Will check heparin level in 8 hours per protocol.  Daily  CBC's  1004 0116 HL 0.28, barely SUBtherapeutic.  Will increase Heparin infusion to 950 units/hr and recheck HL ~8 hours after rate change.   10/05:  HL @ 0329 = < 0.1 Spoke with RN who said gtt was only turned off briefly to collect labs so this result seems valid.  Will order Heparin 2000 units IV X 1 bolus and increase drip rate to 1200 units/hr. Will recheck HL 8 hrs after rate change.   Orene Desanctis, PharmD Clinical Pharmacist 09/04/2020 4:57 AM

## 2020-09-04 NOTE — Brief Op Note (Signed)
BRIEF CARDIAC CATHETERIZATION NOTE  DATE: 09/04/2020  TIME: 3:26 PM  PATIENT:  Frank Proctor  69 y.o. male  PRE-OPERATIVE DIAGNOSIS:  Non-ST segment myocardial infarction, acute systolic congestive heart failure  POST-OPERATIVE DIAGNOSIS:  Same  PROCEDURE:  Procedure(s): RIGHT/LEFT HEART CATH AND CORONARY ANGIOGRAPHY (N/A) CORONARY STENT INTERVENTION (N/A)  SURGEON:  Surgeon(s) and Role:    * Karlton Maya, Harrell Gave, MD - Primary  FINDINGS: 1. Severe two-vessel coronary artery disease with chronic total occlusion of proximal LAD and 90% proximal RCA lesion. 2. Mildly elevated left heart filling pressures. 3. Severely elevated right heart filling pressures. 4. Moderate pulmonary hypertension. 5. Successful PCI of proximal RCA using Resolute Onyx 3.0 x 22 mm drug-eluting stent with 0% residual stenosis and TIMI-3 flow.  RECOMMENDATIONS: 1. DAPT with aspirin and clopidogrel for at least 12 months. 2. Aggressive secondary prevention. 3. Gently increase fluid removal with HD. 4. Outpatient consultation with advanced HF team in [facility] for further management of cardiomyopathy and pulmonary hypertension.  Nelva Bush, MD [facility]

## 2020-09-04 NOTE — Progress Notes (Signed)
Progress Note  Patient Name: Frank Proctor Date of Encounter: 09/04/2020  Primary Cardiologist: [facility] - consult by Fletcher Anon  Subjective   He continues to report 7/10 chest pain. He is resting comfortably in bed. He is for [facility] later today. He underwent dialysis 10/4. BP soft at times in the afternoon of 10/4, possibly during HD.   Inpatient Medications    Scheduled Meds: . aspirin EC  81 mg Oral Daily  . atorvastatin  5 mg Oral Daily  . Chlorhexidine Gluconate Cloth  6 each Topical Q0600  . ferrous sulfate  325 mg Oral BID WC  . gabapentin  300 mg Oral QPM  . influenza vaccine adjuvanted  0.5 mL Intramuscular Tomorrow-1000  . insulin aspart  0-5 Units Subcutaneous QHS  . insulin aspart  0-6 Units Subcutaneous TID WC  . lidocaine  1 patch Transdermal Daily  . sevelamer carbonate  2,400 mg Oral TID WC  . sodium chloride flush  3 mL Intravenous Q12H   Continuous Infusions: . sodium chloride    . [START ON 09/05/2020] sodium chloride    . sodium chloride    . sodium chloride    . azithromycin 500 mg (09/03/20 2136)  . cefTRIAXone (ROCEPHIN)  IV 1 g (09/03/20 2301)  . heparin 1,200 Units/hr (09/04/20 0502)   PRN Meds: sodium chloride, sodium chloride, sodium chloride, acetaminophen, alteplase, hydroxypropyl methylcellulose / hypromellose, lidocaine (PF), lidocaine-prilocaine, menthol-cetylpyridinium, nitroGLYCERIN, ondansetron (ZOFRAN) IV, pentafluoroprop-tetrafluoroeth, sodium chloride flush   Vital Signs    Vitals:   09/04/20 0332 09/04/20 0437 09/04/20 0444 09/04/20 0649  BP: 111/62 (!) 106/56  (!) 106/54  Pulse: 61 (!) 59  62  Resp: 18 19  20   Temp: (!) 97.4 F (36.3 C) 97.7 F (36.5 C)  (!) 97.5 F (36.4 C)  TempSrc: Oral Oral  Oral  SpO2: 100% 100%  99%  Weight:   66.9 kg     Intake/Output Summary (Last 24 hours) at 09/04/2020 0843 Last data filed at 09/04/2020 0502 Gross per 24 hour  Intake 292.48 ml  Output 0 ml  Net 292.48 ml   Filed Weights     09/02/20 1503 09/02/20 1624 09/04/20 0444  Weight: 66.2 kg 66.2 kg 66.9 kg    Telemetry    SR - Personally Reviewed  ECG    No new tracings - Personally Reviewed  Physical Exam   GEN: No acute distress.   Neck: No JVD. Cardiac: RRR, no murmurs, rubs, or gallops.  Respiratory: Diminished breath sounds bilaterally.  GI: Soft, nontender, non-distended.   MS: No stump edema; Status post bilateral BKA. Neuro:  Alert and oriented x 3; Nonfocal.  Psych: Normal affect.  Labs    Chemistry Recent Labs  Lab 09/02/20 1438 09/04/20 0329  NA 143 137  K 5.1 5.7*  CL 99 97*  CO2 30 26  GLUCOSE 168* 162*  BUN 51* 55*  CREATININE 6.08* 6.00*  CALCIUM 9.1 8.8*  PROT 6.3* 5.8*  ALBUMIN 3.2* 3.0*  AST 179* 74*  ALT 202* 138*  ALKPHOS 206* 202*  BILITOT 1.1 0.9  GFRNONAA 9* 9*  GFRAA 10* 10*  ANIONGAP 14 14     Hematology Recent Labs  Lab 09/02/20 1438 09/04/20 0329  WBC 9.2 7.6  RBC 3.34* 3.28*  HGB 10.7* 10.6*  HCT 32.3* 31.2*  MCV 96.7 95.1  MCH 32.0 32.3  MCHC 33.1 34.0  RDW 17.8* 17.5*  PLT 134* 123*    Cardiac EnzymesNo results for input(s):  TROPONINI in the last 168 hours. No results for input(s): TROPIPOC in the last 168 hours.   BNP Recent Labs  Lab 09/02/20 1438  BNP >4,500.0*     DDimer No results for input(s): DDIMER in the last 168 hours.   Radiology    DG Chest Port 1 View  Result Date: 09/02/2020 IMPRESSION: 1. Patchy bilateral airspace opacities with dense consolidation at the left lung base. Findings are concerning for multifocal pneumonia. Recommend follow-up to resolution. 2. Possible layering small left pleural effusion. 3. Mild cardiomegaly. Electronically Signed   By: Margaretha Sheffield MD   On: 09/02/2020 14:46    Cardiac Studies   2D echo 09/03/2020: 1. Left ventricular ejection fraction, by estimation, is 25 to 30%. The  left ventricle has severely decreased function. The left ventricle  demonstrates global hypokinesis. There  is mild left ventricular  hypertrophy. Left ventricular diastolic parameters  are consistent with Grade II diastolic dysfunction (pseudonormalization).  2. Right ventricular systolic function is normal. The right ventricular  size is normal. There is severely elevated pulmonary artery systolic  pressure. The estimated right ventricular systolic pressure is 50.2 mmHg.  3. Left atrial size was moderately dilated.  4. Right atrial size was moderately dilated.  5. Moderate pleural effusion in the left lateral region.  6. The mitral valve is normal in structure. Mild mitral valve  regurgitation. No evidence of mitral stenosis. Moderate mitral annular  calcification.  7. Tricuspid valve regurgitation is moderate.  8. The aortic valve is normal in structure. Aortic valve regurgitation is  not visualized. Mild aortic valve sclerosis is present, with no evidence  of aortic valve stenosis.  9. Moderately dilated pulmonary artery.  10. The inferior vena cava is dilated in size with >50% respiratory  variability, suggesting right atrial pressure of 8 mmHg.  __________  [facility] 09/04/2020: Pending  Patient Profile     69 y.o. 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  Assessment & Plan    1. NSTEMI: -He continues to note chest pain and is for [facility] today, he is not toxic appearing  -Peak HS-Tn of 600 -Heparin gtt -ASA -Lipitor -BP precludes addition of beta blocker   -Given BKA status (presumably from ulcers/osteomyelitis per his account), ESRD, and chronic respiratory failure he is unlikely to be a CABG candidate  -Risks and benefits of cardiac catheterization have been discussed with the patient including risks of bleeding, bruising, infection, kidney damage, stroke, heart attack, urgent need for bypass, injury to a limb, and death. The patient understands these risks and is willing to proceed with the procedure. All questions have been answered and concerns listened to  2. Acute HFrEF: -He continues to appear volume up with fluid management per HD -R/LHC  -Relative hypotension precludes addition of GDMT, escalate as/if able -Daily weights  3. ESRD: -HD per nephrology   4. Hyperkalemia: -Management per nephrology with HD  5. Chronic hypoxic respiratory failure/COPD: -Stable -Per IM  6. HTN: -BP soft on 10/4, slightly  improved this morning   7. Anemia of chronic disease/thrombocytopenia: -Hgb and PLT appear stable -Monitor on heparin drip   For questions or updates, please contact [place] Please consult [email or web address] for contact info under Cardiology/STEMI.    Signed, Christell Faith, PA-C [place] Pager: [phone] 09/04/2020, 8:43 AM

## 2020-09-04 NOTE — Progress Notes (Signed)
Patient back on unit from Cath lab. Patient presented back A/Ox4. Able to verbalize needs. R groin site intact. No swelling, bleeding noted. No pain to touch. VSS. SR on tele. Diet ordered and wife informed on plan of care. Call bell within reach. Will continue to monitor patient.

## 2020-09-04 NOTE — Progress Notes (Signed)
Patient clinically stable post Heart Cath per Dr End with PCI, to proximal RCA, right groin with sheath to venous present. No bleeding nor hematoma visible, awake/alert and oriented post procedure. Sinus brady per monitor. sats stable at this time. Denies complaints at this time. Dr End in to see patient. Report called to care nurse with questions answered. Taking po';s without difficulty.

## 2020-09-04 NOTE — Interval H&P Note (Signed)
History and Physical Interval Note:  09/04/2020 1:50 PM  Frank Proctor  has presented today for surgery, with the diagnosis of NSTEMI and acute systolic congestive heart failure.  The various methods of treatment have been discussed with the patient and family. After consideration of risks, benefits and other options for treatment, the patient has consented to  Procedure(s): RIGHT/LEFT HEART CATH AND CORONARY ANGIOGRAPHY (N/A) as a surgical intervention.  The patient's history has been reviewed, patient examined, no change in status, stable for surgery.  I have reviewed the patient's chart and labs.  Questions were answered to the patient's satisfaction.  He has agreed to rescind his DNR/DNI order for the duration of the procedure.  Cath Lab Visit (complete for each Cath Lab visit)  Clinical Evaluation Leading to the Procedure:   ACS: Yes.    Non-ACS:  N/A  Altheria Shadoan

## 2020-09-04 NOTE — Progress Notes (Signed)
SLP Cancellation Note  Patient Details Name: Frank Proctor MRN: [id number] DOB: [birth year]   Cancelled treatment:       Reason Eval/Treat Not Completed: Patient at procedure or test/unavailable (pt is currently NPO for test). Chart reviewed; consulted NSG and met w/ pt at bedside. Pt was verbally conversive; min HOH. He described his usual regular diet to Clinicians noting he "choked on a piece of celery" last night at dinner. Pt is Edentulous which impacts ability to sufficiently break down hard, particulate solids. Pt also COPD and wear home O2 at baseline -- any SOB can impact the timing of pharyngeal swallowing.  Discussed and recommended to pt to consider less raw, uncooked foods that are more particulate (salads which he had been eating) and choose more cooked, moist, cohesive foods easier for gumming/mashing.  ST services will f/u w/ pt tomorrow at a meal for further education as needed. NSG reported good toleration of swallowing Pills w/ water this AM. Recommend general aspiration precautions w/ any oral intake. NSG updated.     Orinda Kenner, MS, CCC-SLP Speech Language Pathologist Rehab Services [phone] [facility] 09/04/2020, 11:23 AM

## 2020-09-04 NOTE — Progress Notes (Addendum)
1       PROGRESS NOTE    Frank Proctor  EAV:[id number] DOB: 26-Aug-1951 DOA: 09/02/2020 PCP: [facility]   Brief Narrative:   Frank Proctor is a 69 y.o. male with medical history significant of chronic respiratory failure secondary to COPD, on 2 L at home, type 1 diabetes, ESRD on HD(MWF), bilateral BKA, recently started on antibiotics for pneumonia  admitted for chest pain/unstable angina. According to patient somebody turned off his oxygen at his facility and that is the reason he started some shortness of breath and chest pain.  Shortness of breath improved with restarting oxygen.,  He received one-time dose of sublingual nitroglycerin with some minor improvement in his chest pain.  He was describing chest pain as sharp, 7 out of 10 in intensity at this time, radiating to left shoulder.  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 Continue to have some cough, stating it is improving with antibiotics but could not remember the name. Last dialysis was on Friday.  He still makes urine and denies any urinary symptoms.  [facility]   ED Course: Hemodynamically stable, EKG without any significant ST changes, labs with hemoglobin of 10.7, platelets of 134, BUN and creatinine consistent with ESRD, AST of 179, ALT of 202, alkaline phosphatase of 206, troponin at 602.  Chest x-ray with patchy bilateral airspace opacities with dense consolidation at the left lung base.  COVID-19 PCR negative. Patient was started on heparin infusion.   Assessment & Plan:   Principal Problem:   Multifocal pneumonia Active Problems:   Demand ischemia ([place])   Pressure injury of skin  NSTEMI.  Patient has chest pain with some typical components.  Troponin at 607 - EKG without any acute ST changes.  -Cardiology planning cardiac cath today on 10/5 -Echo shows EF of 25 to 30% with global hypokinesis, grade 2 diastolic dysfunction and severe pulmonary  hypertension. -Sublingual nitroglycerin as needed.   -Continue aspirin, heparin drip and statin -Continue low-dose Lipitor at 5 mg p.o. daily.  LFTs are improving  Acute systolic heart failure BNP more than 4500.  Echo shows EF of 25 to 30% with global hypokinesis Clinically seems more fluid overloaded.  Patient got dialyzed yesterday.  Further dialysis per nephrology  Transaminitis.    Improving.  Monitor being on statin  Recent pneumonia.  Chest x-ray with left-sided consolidation along with some patchy bilateral infiltrates.  COVID-19 negative.  He was recently started on some antibiotics at his facility, does not remember the name. Afebrile with no leukocytosis.  Procalcitonin elevated at 1.08. -Continue empiric ceftriaxone and Zithromax for now.  It is likely more from fluid overload than pneumonia.  We'll stop antibiotics as symptoms improve.  We'll trend procalcitonin to decide need for antibiotics.  Chronic respiratory failure secondary to COPD.  No sign of acute exacerbation.  Patient saturating well on his home oxygen of 2 L. -Continue with supplemental oxygen.  ESRD.  Patient was dialyzed yesterday.  Next session will be tomorrow -routine dialysis on Monday, Wednesday and Friday.  Type 1 diabetes.  -SSI.  Hemoglobin A1c 7.2  Hypertension.  Blood pressure within goal.Continue to monitor  Body mass index is 22.43 kg/m.  Pressure Injury 09/03/20 Sacrum Medial Stage 1 -  Intact skin with non-blanchable redness of a localized area usually over a bony prominence. (Active)  09/03/20 0548  Location: Sacrum  Location Orientation: Medial  Staging: Stage 1 -  Intact skin with non-blanchable  redness of a localized area usually over a bony prominence.  Wound Description (Comments):   Present on Admission:      DVT prophylaxis:   On heparin drip Code Status: Full Code Family Communication: Discussed with patient Disposition Plan: In next 1-2 days depending on cardiac  evaluation Status is: Inpatient  Remains inpatient appropriate because:Ongoing diagnostic testing needed not appropriate for outpatient work up   Dispo: The patient is from: Home              Anticipated d/c is to: Home              Anticipated d/c date is: 2 days              Patient currently is not medically stable to d/c.  Consultants:   Cardiology   Procedures: Echo, cath   Antimicrobials: IV Rocephin and Zithromax started on 10/3  Subjective:  chest pain 7 out of 10.  Waiting for cardiac cath today.  Objective: Vitals:   09/04/20 1100 09/04/20 1146 09/04/20 1227 09/04/20 1346  BP:  119/64 118/61   Pulse:  63 63   Resp:  18 20   Temp:  (!) 97.4 F (36.3 C) 98.4 F (36.9 C)   TempSrc:  Oral Oral   SpO2:  98% 96% 92%  Weight:   66.9 kg   Height: 5\' 8"  (1.727 m)  5\' 8"  (1.727 m)     Intake/Output Summary (Last 24 hours) at 09/04/2020 1413 Last data filed at 09/04/2020 1213 Gross per 24 hour  Intake 292.48 ml  Output 0 ml  Net 292.48 ml   Filed Weights   09/02/20 1624 09/04/20 0444 09/04/20 1227  Weight: 66.2 kg 66.9 kg 66.9 kg    Examination:  General exam: Appears calm and comfortable  Respiratory system: Decreased breath sound at bases.  Respiratory effort normal. Cardiovascular system: S1 & S2 heard, RRR. No JVD, murmurs, rubs, gallops or clicks. No pedal edema. Gastrointestinal system: Abdomen is nondistended, soft and nontender. No organomegaly or masses felt. Normal bowel sounds heard. Central nervous system: Alert and oriented. No focal neurological deficits. Extremities: Symmetric 5 x 5 power.  BKA bilaterally Skin: No rashes, lesions or ulcers Psychiatry: Judgement and insight appear normal. Mood & affect appropriate.  Dialysis access -right upper extremity AV fistula   Data Reviewed: I have personally reviewed following labs and imaging studies  CBC: Recent Labs  Lab 09/02/20 1438 09/04/20 0329  WBC 9.2 7.6  HGB 10.7* 10.6*  HCT 32.3*  31.2*  MCV 96.7 95.1  PLT 134* 071*   Basic Metabolic Panel: Recent Labs  Lab 09/02/20 1438 09/04/20 0329  NA 143 137  K 5.1 5.7*  CL 99 97*  CO2 30 26  GLUCOSE 168* 162*  BUN 51* 55*  CREATININE 6.08* 6.00*  CALCIUM 9.1 8.8*   GFR: Estimated Creatinine Clearance: 10.8 mL/min (A) (by C-G formula based on SCr of 6 mg/dL (H)). Liver Function Tests: Recent Labs  Lab 09/02/20 1438 09/04/20 0329  AST 179* 74*  ALT 202* 138*  ALKPHOS 206* 202*  BILITOT 1.1 0.9  PROT 6.3* 5.8*  ALBUMIN 3.2* 3.0*   Recent Labs  Lab 09/02/20 1438  LIPASE 37   No results for input(s): AMMONIA in the last 168 hours. Coagulation Profile: Recent Labs  Lab 09/02/20 1619  INR 1.2   Cardiac Enzymes: No results for input(s): CKTOTAL, CKMB, CKMBINDEX, TROPONINI in the last 168 hours. BNP (last 3 results) No results for input(s): PROBNP  in the last 8760 hours. HbA1C: Recent Labs    09/03/20 0520  HGBA1C 7.2*   CBG: Recent Labs  Lab 09/03/20 1244 09/03/20 1831 09/03/20 2013 09/04/20 0759 09/04/20 1147  GLUCAP 124* 150* 146* 114* 100*   Lipid Profile: Recent Labs    09/03/20 0520  CHOL 55  HDL 37*  LDLCALC 15  TRIG 17  CHOLHDL 1.5   Thyroid Function Tests: No results for input(s): TSH, T4TOTAL, FREET4, T3FREE, THYROIDAB in the last 72 hours. Anemia Panel: No results for input(s): VITAMINB12, FOLATE, FERRITIN, TIBC, IRON, RETICCTPCT in the last 72 hours. Sepsis Labs: Recent Labs  Lab 09/02/20 1619  PROCALCITON 1.08    Recent Results (from the past 240 hour(s))  Respiratory Panel by RT PCR (Flu A&B, Covid) - Nasopharyngeal Swab     Status: None   Collection Time: 09/02/20  3:06 PM   Specimen: Nasopharyngeal Swab  Result Value Ref Range Status   SARS Coronavirus 2 by RT PCR NEGATIVE NEGATIVE Final    Comment: (NOTE) SARS-CoV-2 target nucleic acids are NOT DETECTED.  The SARS-CoV-2 RNA is generally detectable in upper respiratoy specimens during the acute phase of  infection. The lowest concentration of SARS-CoV-2 viral copies this assay can detect is 131 copies/mL. A negative result does not preclude SARS-Cov-2 infection and should not be used as the sole basis for treatment or other patient management decisions. A negative result may occur with  improper specimen collection/handling, submission of specimen other than nasopharyngeal swab, presence of viral mutation(s) within the areas targeted by this assay, and inadequate number of viral copies (<131 copies/mL). A negative result must be combined with clinical observations, patient history, and epidemiological information. The expected result is Negative.  Fact Sheet for Patients:  PinkCheek.be  Fact Sheet for Healthcare Providers:  GravelBags.it  This test is no t yet approved or cleared by the [place] FDA and  has been authorized for detection and/or diagnosis of SARS-CoV-2 by FDA under an Emergency Use Authorization (EUA). This EUA will remain  in effect (meaning this test can be used) for the duration of the COVID-19 declaration under Section 564(b)(1) of the Act, 21 U.S.C. section 360bbb-3(b)(1), unless the authorization is terminated or revoked sooner.     Influenza A by PCR NEGATIVE NEGATIVE Final   Influenza B by PCR NEGATIVE NEGATIVE Final    Comment: (NOTE) The Xpert Xpress SARS-CoV-2/FLU/RSV assay is intended as an aid in  the diagnosis of influenza from Nasopharyngeal swab specimens and  should not be used as a sole basis for treatment. Nasal washings and  aspirates are unacceptable for Xpert Xpress SARS-CoV-2/FLU/RSV  testing.  Fact Sheet for Patients: PinkCheek.be  Fact Sheet for Healthcare Providers: GravelBags.it  This test is not yet approved or cleared by the [place] FDA and  has been authorized for detection and/or diagnosis of SARS-CoV-2  by  FDA under an Emergency Use Authorization (EUA). This EUA will remain  in effect (meaning this test can be used) for the duration of the  Covid-19 declaration under Section 564(b)(1) of the Act, 21  U.S.C. section 360bbb-3(b)(1), unless the authorization is  terminated or revoked. Performed at [facility], [address] [place], [place] [address]          Radiology Studies: [facility] Chest [place] 1 View  Result Date: 09/02/2020 CLINICAL DATA:  Patient removed his oxygen. Reportedly recent pneumonia. EXAM: PORTABLE CHEST 1 VIEW COMPARISON:  01/11/2020 FINDINGS: Patchy bilateral airspace opacities with dense consolidation at the left  lung base. Possible layering small left pleural effusion. No discernible pneumothorax. Mild enlargement the cardiac silhouette. Aortic atherosclerosis. Multiple clips projecting over the bilateral arms and axilla. IMPRESSION: 1. Patchy bilateral airspace opacities with dense consolidation at the left lung base. Findings are concerning for multifocal pneumonia. Recommend follow-up to resolution. 2. Possible layering small left pleural effusion. 3. Mild cardiomegaly. Electronically Signed   By: Margaretha Sheffield MD   On: 09/02/2020 14:46   ECHOCARDIOGRAM COMPLETE  Result Date: 09/03/2020    ECHOCARDIOGRAM REPORT   Patient Name:   Frank Proctor Date of Exam: 09/03/2020 Medical Rec #:  [id number]        Height:       68.0 in Accession #:    [id number]       Weight:       146.0 lb Date of Birth:  21-May-1951       BSA:          1.788 m Patient Age:    48 years         BP:           98/41 mmHg Patient Gender: M                HR:           58 bpm. Exam Location:  [facility] Procedure: 2D Echo, Cardiac Doppler and Color Doppler Indications:     Acute coronary syndrome 124.9  History:         Patient has no prior history of Echocardiogram examinations.                  Risk Factors:Diabetes.  Sonographer:     Sherrie Sport RDCS (AE) Referring Phys:  [id number] [facility] AMIN  Diagnosing Phys: Kathlyn Sacramento MD IMPRESSIONS  1. Left ventricular ejection fraction, by estimation, is 25 to 30%. The left ventricle has severely decreased function. The left ventricle demonstrates global hypokinesis. There is mild left ventricular hypertrophy. Left ventricular diastolic parameters  are consistent with Grade II diastolic dysfunction (pseudonormalization).  2. Right ventricular systolic function is normal. The right ventricular size is normal. There is severely elevated pulmonary artery systolic pressure. The estimated right ventricular systolic pressure is 02.7 mmHg.  3. Left atrial size was moderately dilated.  4. Right atrial size was moderately dilated.  5. Moderate pleural effusion in the left lateral region.  6. The mitral valve is normal in structure. Mild mitral valve regurgitation. No evidence of mitral stenosis. Moderate mitral annular calcification.  7. Tricuspid valve regurgitation is moderate.  8. The aortic valve is normal in structure. Aortic valve regurgitation is not visualized. Mild aortic valve sclerosis is present, with no evidence of aortic valve stenosis.  9. Moderately dilated pulmonary artery. 10. The inferior vena cava is dilated in size with >50% respiratory variability, suggesting right atrial pressure of 8 mmHg. FINDINGS  Left Ventricle: Left ventricular ejection fraction, by estimation, is 25 to 30%. The left ventricle has severely decreased function. The left ventricle demonstrates global hypokinesis. The left ventricular internal cavity size was normal in size. There is mild left ventricular hypertrophy. Left ventricular diastolic parameters are consistent with Grade II diastolic dysfunction (pseudonormalization). Right Ventricle: The right ventricular size is normal. No increase in right ventricular wall thickness. Right ventricular systolic function is normal. There is severely elevated pulmonary artery systolic pressure. The tricuspid regurgitant velocity is 3.83  m/s, and with an assumed right atrial pressure of 8 mmHg, the estimated right ventricular systolic pressure is 66.7  mmHg. Left Atrium: Left atrial size was moderately dilated. Right Atrium: Right atrial size was moderately dilated. Pericardium: There is no evidence of pericardial effusion. Mitral Valve: The mitral valve is normal in structure. Moderate mitral annular calcification. Mild mitral valve regurgitation. No evidence of mitral valve stenosis. Tricuspid Valve: The tricuspid valve is normal in structure. Tricuspid valve regurgitation is moderate . No evidence of tricuspid stenosis. Aortic Valve: The aortic valve is normal in structure. Aortic valve regurgitation is not visualized. Mild aortic valve sclerosis is present, with no evidence of aortic valve stenosis. Aortic valve mean gradient measures 3.0 mmHg. Aortic valve peak gradient measures 5.4 mmHg. Aortic valve area, by VTI measures 1.83 cm. Pulmonic Valve: The pulmonic valve was normal in structure. Pulmonic valve regurgitation is mild. No evidence of pulmonic stenosis. Aorta: The aortic root is normal in size and structure. Pulmonary Artery: The pulmonary artery is moderately dilated. Venous: The inferior vena cava is dilated in size with greater than 50% respiratory variability, suggesting right atrial pressure of 8 mmHg. IAS/Shunts: No atrial level shunt detected by color flow Doppler. Additional Comments: There is a moderate pleural effusion in the left lateral region.  LEFT VENTRICLE PLAX 2D LVIDd:         5.43 cm     Diastology LVIDs:         4.82 cm     LV e' medial:    5.66 cm/s LV PW:         1.15 cm     LV E/e' medial:  18.9 LV IVS:        1.13 cm     LV e' lateral:   7.40 cm/s LVOT diam:     2.00 cm     LV E/e' lateral: 14.5 LV SV:         50 LV SV Index:   28 LVOT Area:     3.14 cm  LV Volumes (MOD) LV vol d, MOD A2C: 80.4 ml LV vol d, MOD A4C: 67.1 ml LV vol s, MOD A2C: 56.6 ml LV vol s, MOD A4C: 50.9 ml LV SV MOD A2C:     23.8 ml LV SV  MOD A4C:     67.1 ml LV SV MOD BP:      22.9 ml RIGHT VENTRICLE RV Basal diam:  4.57 cm RV S prime:     9.46 cm/s TAPSE (M-mode): 4.3 cm LEFT ATRIUM              Index       RIGHT ATRIUM           Index LA diam:        4.10 cm  2.29 cm/m  RA Area:     32.70 cm LA Vol (A2C):   122.0 ml 68.24 ml/m RA Volume:   121.00 ml 67.68 ml/m LA Vol (A4C):   45.9 ml  25.67 ml/m LA Biplane Vol: 78.7 ml  44.02 ml/m  AORTIC VALVE                   PULMONIC VALVE AV Area (Vmax):    1.56 cm    PV Vmax:        0.54 m/s AV Area (Vmean):   1.70 cm    PV Peak grad:   1.2 mmHg AV Area (VTI):     1.83 cm    RVOT Peak grad: 3 mmHg AV Vmax:           116.33 cm/s AV  Vmean:          78.200 cm/s AV VTI:            0.273 m AV Peak Grad:      5.4 mmHg AV Mean Grad:      3.0 mmHg LVOT Vmax:         57.60 cm/s LVOT Vmean:        42.200 cm/s LVOT VTI:          0.159 m LVOT/AV VTI ratio: 0.58  AORTA Ao Root diam: 3.00 cm MITRAL VALVE                TRICUSPID VALVE MV Area (PHT): 3.53 cm     TR Peak grad:   58.7 mmHg MV Decel Time: 215 msec     TR Vmax:        383.00 cm/s MV E velocity: 107.00 cm/s MV A velocity: 98.00 cm/s   SHUNTS MV E/A ratio:  1.09         Systemic VTI:  0.16 m                             Systemic Diam: 2.00 cm Kathlyn Sacramento MD Electronically signed by Kathlyn Sacramento MD Signature Date/Time: 09/03/2020/10:07:47 AM    Final         Scheduled Meds: . [MAR Hold] aspirin EC  81 mg Oral Daily  . [MAR Hold] atorvastatin  5 mg Oral Daily  . [MAR Hold] Chlorhexidine Gluconate Cloth  6 each Topical Q0600  . [MAR Hold] ferrous sulfate  325 mg Oral BID WC  . [MAR Hold] gabapentin  300 mg Oral QPM  . influenza vaccine adjuvanted  0.5 mL Intramuscular Tomorrow-1000  . [MAR Hold] insulin aspart  0-5 Units Subcutaneous QHS  . [MAR Hold] insulin aspart  0-6 Units Subcutaneous TID WC  . [MAR Hold] lidocaine  1 patch Transdermal Daily  . [MAR Hold] sevelamer carbonate  2,400 mg Oral TID WC  . sodium chloride flush  3 mL  Intravenous Q12H   Continuous Infusions: . sodium chloride    . [START ON 09/05/2020] sodium chloride    . [MAR Hold] sodium chloride    . [MAR Hold] sodium chloride    . [MAR Hold] azithromycin 500 mg (09/03/20 2136)  . [MAR Hold] cefTRIAXone (ROCEPHIN)  IV 1 g (09/03/20 2301)  . heparin 1,200 Units/hr (09/04/20 0502)     LOS: 2 days    Time spent: 47 minutes    Lynelle Weiler Manuella Ghazi, MD [facility] Pager [phone]  If 7PM-7AM, please contact night-coverage [email or web address] Password [email or web address] 09/04/2020, 2:13 PM

## 2020-09-04 NOTE — Progress Notes (Signed)
[facility]  ROUNDING NOTE   Subjective:   [name] is a 69 years old gentleman known to our practice. He is came to ED  On 09/03/20 with SOB and chest pain.Patient was found to be fluid overloaded,dialysed him on admission. Patient reports feeling much better today, still continuing on his baseline home O2 2 L via nasal cannula.  Peripheral edema improving.   Objective:  Vital signs in last 24 hours:  Temp:  [97.4 F (36.3 C)-98.4 F (36.9 C)] 98.4 F (36.9 C) (10/05 1227) Pulse Rate:  [59-79] 63 (10/05 1227) Resp:  [13-20] 20 (10/05 1227) BP: (106-169)/(45-104) 118/61 (10/05 1227) SpO2:  [91 %-100 %] 96 % (10/05 1227) Weight:  [66.9 kg] 66.9 kg (10/05 1227)  Weight change: 0.635 kg Filed Weights   09/02/20 1624 09/04/20 0444 09/04/20 1227  Weight: 66.2 kg 66.9 kg 66.9 kg    Intake/Output: I/O last 3 completed shifts: In: 782.5 [P.O.:240; I.V.:292.5; IV Piggyback:250] Out: 0    Intake/Output this shift:  No intake/output data recorded.  Physical Exam: General:  In no acute distress, lying in bed, reading  Head: Moist oral mucosal membranes  Eyes: Anicteric  Neck: Supple, trachea at midline  Lungs:   Lungs clear, diminished at the bases, normal and symmetric respiratory effort  Heart:  S1S2 regular, no rubs or gallops  Abdomen:   Nondistended  Extremities:  Bilateral BKA, 1+ pitting edema on upper legs  Neurologic:  Oriented x3, speech clear and appropriate  Skin: No acute rashes or lesions  Access:  Right upper extremity AV fistula +bruit,+ thrill    Basic Metabolic Panel: Recent Labs  Lab 09/02/20 1438 09/04/20 0329  NA 143 137  K 5.1 5.7*  CL 99 97*  CO2 30 26  GLUCOSE 168* 162*  BUN 51* 55*  CREATININE 6.08* 6.00*  CALCIUM 9.1 8.8*    Liver Function Tests: Recent Labs  Lab 09/02/20 1438 09/04/20 0329  AST 179* 74*  ALT 202* 138*  ALKPHOS 206* 202*  BILITOT 1.1 0.9  PROT 6.3* 5.8*  ALBUMIN 3.2* 3.0*   Recent Labs   Lab 09/02/20 1438  LIPASE 37   No results for input(s): AMMONIA in the last 168 hours.  CBC: Recent Labs  Lab 09/02/20 1438 09/04/20 0329  WBC 9.2 7.6  HGB 10.7* 10.6*  HCT 32.3* 31.2*  MCV 96.7 95.1  PLT 134* 123*    Cardiac Enzymes: No results for input(s): CKTOTAL, CKMB, CKMBINDEX, TROPONINI in the last 168 hours.  BNP: Invalid input(s): POCBNP  CBG: Recent Labs  Lab 09/03/20 1244 09/03/20 1831 09/03/20 2013 09/04/20 0759 09/04/20 1147  GLUCAP 124* 150* 146* 114* 100*    Microbiology: Results for orders placed or performed during the hospital encounter of 09/02/20  Respiratory Panel by RT PCR (Flu A&B, Covid) - Nasopharyngeal Swab     Status: None   Collection Time: 09/02/20  3:06 PM   Specimen: Nasopharyngeal Swab  Result Value Ref Range Status   SARS Coronavirus 2 by RT PCR NEGATIVE NEGATIVE Final    Comment: (NOTE) SARS-CoV-2 target nucleic acids are NOT DETECTED.  The SARS-CoV-2 RNA is generally detectable in upper respiratoy specimens during the acute phase of infection. The lowest concentration of SARS-CoV-2 viral copies this assay can detect is 131 copies/mL. A negative result does not preclude SARS-Cov-2 infection and should not be used as the sole basis for treatment or other patient management decisions. A negative result may occur with  improper specimen collection/handling, submission  of specimen other than nasopharyngeal swab, presence of viral mutation(s) within the areas targeted by this assay, and inadequate number of viral copies (<131 copies/mL). A negative result must be combined with clinical observations, patient history, and epidemiological information. The expected result is Negative.  Fact Sheet for Patients:  PinkCheek.be  Fact Sheet for Healthcare Providers:  GravelBags.it  This test is no t yet approved or cleared by the [place] FDA and  has been authorized  for detection and/or diagnosis of SARS-CoV-2 by FDA under an Emergency Use Authorization (EUA). This EUA will remain  in effect (meaning this test can be used) for the duration of the COVID-19 declaration under Section 564(b)(1) of the Act, 21 U.S.C. section 360bbb-3(b)(1), unless the authorization is terminated or revoked sooner.     Influenza A by PCR NEGATIVE NEGATIVE Final   Influenza B by PCR NEGATIVE NEGATIVE Final    Comment: (NOTE) The Xpert Xpress SARS-CoV-2/FLU/RSV assay is intended as an aid in  the diagnosis of influenza from Nasopharyngeal swab specimens and  should not be used as a sole basis for treatment. Nasal washings and  aspirates are unacceptable for Xpert Xpress SARS-CoV-2/FLU/RSV  testing.  Fact Sheet for Patients: PinkCheek.be  Fact Sheet for Healthcare Providers: GravelBags.it  This test is not yet approved or cleared by the [place] FDA and  has been authorized for detection and/or diagnosis of SARS-CoV-2 by  FDA under an Emergency Use Authorization (EUA). This EUA will remain  in effect (meaning this test can be used) for the duration of the  Covid-19 declaration under Section 564(b)(1) of the Act, 21  U.S.C. section 360bbb-3(b)(1), unless the authorization is  terminated or revoked. Performed at [facility], [address] [place], [place] [address]     Coagulation Studies: Recent Labs    09/02/20 1619  LABPROT 14.5  INR 1.2    Urinalysis: No results for input(s): COLORURINE, LABSPEC, PHURINE, GLUCOSEU, HGBUR, BILIRUBINUR, KETONESUR, PROTEINUR, UROBILINOGEN, NITRITE, LEUKOCYTESUR in the last 72 hours.  Invalid input(s): APPERANCEUR    Imaging: DG Chest Port 1 View  Result Date: 09/02/2020 CLINICAL DATA:  Patient removed his oxygen. Reportedly recent pneumonia. EXAM: PORTABLE CHEST 1 VIEW COMPARISON:  01/11/2020 FINDINGS: Patchy bilateral airspace opacities with  dense consolidation at the left lung base. Possible layering small left pleural effusion. No discernible pneumothorax. Mild enlargement the cardiac silhouette. Aortic atherosclerosis. Multiple clips projecting over the bilateral arms and axilla. IMPRESSION: 1. Patchy bilateral airspace opacities with dense consolidation at the left lung base. Findings are concerning for multifocal pneumonia. Recommend follow-up to resolution. 2. Possible layering small left pleural effusion. 3. Mild cardiomegaly. Electronically Signed   By: Margaretha Sheffield MD   On: 09/02/2020 14:46   ECHOCARDIOGRAM COMPLETE  Result Date: 09/03/2020    ECHOCARDIOGRAM REPORT   Patient Name:   Frank Proctor Date of Exam: 09/03/2020 Medical Rec #:  [id number]        Height:       68.0 in Accession #:    [id number]       Weight:       146.0 lb Date of Birth:  10/30/1951       BSA:          1.788 m Patient Age:    82 years         BP:           98/41 mmHg Patient Gender: M  HR:           58 bpm. Exam Location:  [facility] Procedure: 2D Echo, Cardiac Doppler and Color Doppler Indications:     Acute coronary syndrome 124.9  History:         Patient has no prior history of Echocardiogram examinations.                  Risk Factors:Diabetes.  Sonographer:     Sherrie Sport RDCS (AE) Referring Phys:  [id number] [facility] AMIN Diagnosing Phys: Kathlyn Sacramento MD IMPRESSIONS  1. Left ventricular ejection fraction, by estimation, is 25 to 30%. The left ventricle has severely decreased function. The left ventricle demonstrates global hypokinesis. There is mild left ventricular hypertrophy. Left ventricular diastolic parameters  are consistent with Grade II diastolic dysfunction (pseudonormalization).  2. Right ventricular systolic function is normal. The right ventricular size is normal. There is severely elevated pulmonary artery systolic pressure. The estimated right ventricular systolic pressure is 71.2 mmHg.  3. Left atrial size was moderately dilated.   4. Right atrial size was moderately dilated.  5. Moderate pleural effusion in the left lateral region.  6. The mitral valve is normal in structure. Mild mitral valve regurgitation. No evidence of mitral stenosis. Moderate mitral annular calcification.  7. Tricuspid valve regurgitation is moderate.  8. The aortic valve is normal in structure. Aortic valve regurgitation is not visualized. Mild aortic valve sclerosis is present, with no evidence of aortic valve stenosis.  9. Moderately dilated pulmonary artery. 10. The inferior vena cava is dilated in size with >50% respiratory variability, suggesting right atrial pressure of 8 mmHg. FINDINGS  Left Ventricle: Left ventricular ejection fraction, by estimation, is 25 to 30%. The left ventricle has severely decreased function. The left ventricle demonstrates global hypokinesis. The left ventricular internal cavity size was normal in size. There is mild left ventricular hypertrophy. Left ventricular diastolic parameters are consistent with Grade II diastolic dysfunction (pseudonormalization). Right Ventricle: The right ventricular size is normal. No increase in right ventricular wall thickness. Right ventricular systolic function is normal. There is severely elevated pulmonary artery systolic pressure. The tricuspid regurgitant velocity is 3.83 m/s, and with an assumed right atrial pressure of 8 mmHg, the estimated right ventricular systolic pressure is 45.8 mmHg. Left Atrium: Left atrial size was moderately dilated. Right Atrium: Right atrial size was moderately dilated. Pericardium: There is no evidence of pericardial effusion. Mitral Valve: The mitral valve is normal in structure. Moderate mitral annular calcification. Mild mitral valve regurgitation. No evidence of mitral valve stenosis. Tricuspid Valve: The tricuspid valve is normal in structure. Tricuspid valve regurgitation is moderate . No evidence of tricuspid stenosis. Aortic Valve: The aortic valve is normal in  structure. Aortic valve regurgitation is not visualized. Mild aortic valve sclerosis is present, with no evidence of aortic valve stenosis. Aortic valve mean gradient measures 3.0 mmHg. Aortic valve peak gradient measures 5.4 mmHg. Aortic valve area, by VTI measures 1.83 cm. Pulmonic Valve: The pulmonic valve was normal in structure. Pulmonic valve regurgitation is mild. No evidence of pulmonic stenosis. Aorta: The aortic root is normal in size and structure. Pulmonary Artery: The pulmonary artery is moderately dilated. Venous: The inferior vena cava is dilated in size with greater than 50% respiratory variability, suggesting right atrial pressure of 8 mmHg. IAS/Shunts: No atrial level shunt detected by color flow Doppler. Additional Comments: There is a moderate pleural effusion in the left lateral region.  LEFT VENTRICLE PLAX 2D LVIDd:  5.43 cm     Diastology LVIDs:         4.82 cm     LV e' medial:    5.66 cm/s LV PW:         1.15 cm     LV E/e' medial:  18.9 LV IVS:        1.13 cm     LV e' lateral:   7.40 cm/s LVOT diam:     2.00 cm     LV E/e' lateral: 14.5 LV SV:         50 LV SV Index:   28 LVOT Area:     3.14 cm  LV Volumes (MOD) LV vol d, MOD A2C: 80.4 ml LV vol d, MOD A4C: 67.1 ml LV vol s, MOD A2C: 56.6 ml LV vol s, MOD A4C: 50.9 ml LV SV MOD A2C:     23.8 ml LV SV MOD A4C:     67.1 ml LV SV MOD BP:      22.9 ml RIGHT VENTRICLE RV Basal diam:  4.57 cm RV S prime:     9.46 cm/s TAPSE (M-mode): 4.3 cm LEFT ATRIUM              Index       RIGHT ATRIUM           Index LA diam:        4.10 cm  2.29 cm/m  RA Area:     32.70 cm LA Vol (A2C):   122.0 ml 68.24 ml/m RA Volume:   121.00 ml 67.68 ml/m LA Vol (A4C):   45.9 ml  25.67 ml/m LA Biplane Vol: 78.7 ml  44.02 ml/m  AORTIC VALVE                   PULMONIC VALVE AV Area (Vmax):    1.56 cm    PV Vmax:        0.54 m/s AV Area (Vmean):   1.70 cm    PV Peak grad:   1.2 mmHg AV Area (VTI):     1.83 cm    RVOT Peak grad: 3 mmHg AV Vmax:            116.33 cm/s AV Vmean:          78.200 cm/s AV VTI:            0.273 m AV Peak Grad:      5.4 mmHg AV Mean Grad:      3.0 mmHg LVOT Vmax:         57.60 cm/s LVOT Vmean:        42.200 cm/s LVOT VTI:          0.159 m LVOT/AV VTI ratio: 0.58  AORTA Ao Root diam: 3.00 cm MITRAL VALVE                TRICUSPID VALVE MV Area (PHT): 3.53 cm     TR Peak grad:   58.7 mmHg MV Decel Time: 215 msec     TR Vmax:        383.00 cm/s MV E velocity: 107.00 cm/s MV A velocity: 98.00 cm/s   SHUNTS MV E/A ratio:  1.09         Systemic VTI:  0.16 m                             Systemic Diam: 2.00 cm Kathlyn Sacramento  MD Electronically signed by Kathlyn Sacramento MD Signature Date/Time: 09/03/2020/10:07:47 AM    Final      Medications:    sodium chloride     [START ON 09/05/2020] sodium chloride     sodium chloride     sodium chloride     azithromycin 500 mg (09/03/20 2136)   cefTRIAXone (ROCEPHIN)  IV 1 g (09/03/20 2301)   heparin 1,200 Units/hr (09/04/20 0502)    aspirin EC  81 mg Oral Daily   atorvastatin  5 mg Oral Daily   Chlorhexidine Gluconate Cloth  6 each Topical Q0600   ferrous sulfate  325 mg Oral BID WC   gabapentin  300 mg Oral QPM   influenza vaccine adjuvanted  0.5 mL Intramuscular Tomorrow-1000   insulin aspart  0-5 Units Subcutaneous QHS   insulin aspart  0-6 Units Subcutaneous TID WC   lidocaine  1 patch Transdermal Daily   sevelamer carbonate  2,400 mg Oral TID WC   sodium chloride flush  3 mL Intravenous Q12H   sodium chloride, sodium chloride, sodium chloride, acetaminophen, alteplase, hydroxypropyl methylcellulose / hypromellose, lidocaine (PF), lidocaine-prilocaine, menthol-cetylpyridinium, nitroGLYCERIN, ondansetron (ZOFRAN) IV, pentafluoroprop-tetrafluoroeth, sodium chloride flush  Assessment/ Plan:  [name] is a 69 y.o.  male old gentleman well known to our practice. He cameto ED today with SOB and chest pain.Patient was found to be fluid overloaded.   ESRD on  dialysis MWF Right upper arm AV fistula Patient got dialyzed yesterday, with 2 L fluid off. Volume and electrolyte status acceptable now No need for additional dialysis today  Chest pain/shortness of breath/ACS Troponin elevated in ED Received aspirin On heparin infusion  NPO for cardiac cath today  Hypoxic respiratory failure  BNP >4500 on arrival Shortness of breath better, on 2 L of supplemental O2 which is his baseline home O2  Hypertension Blood pressure readings within acceptable range      LOS: 2 Aisling Emigh 10/5/20211:10 PM

## 2020-09-04 NOTE — Progress Notes (Signed)
New IV placed due to positional obstructiveness.  20g insert to left forearm w/ rapid blood return.  Flushed w/o resist.  IV anbx infusing w/o difficulty.  Pt tolerated well.

## 2020-09-04 NOTE — Progress Notes (Signed)
Hemodialysis patient known at [facility] MWF 6:00am. Patient is transported by [place] transportation. Please contact me with any dialysis placement concerns.  Elvera Bicker Dialysis Coordinator [phone]

## 2020-09-05 ENCOUNTER — Encounter: Payer: Self-pay | Admitting: Internal Medicine

## 2020-09-05 DIAGNOSIS — I5021 Acute systolic (congestive) heart failure: Secondary | ICD-10-CM | POA: Diagnosis not present

## 2020-09-05 DIAGNOSIS — I214 Non-ST elevation (NSTEMI) myocardial infarction: Secondary | ICD-10-CM | POA: Diagnosis not present

## 2020-09-05 DIAGNOSIS — E875 Hyperkalemia: Secondary | ICD-10-CM

## 2020-09-05 DIAGNOSIS — J189 Pneumonia, unspecified organism: Secondary | ICD-10-CM

## 2020-09-05 LAB — PHOSPHORUS: Phosphorus: 7.2 mg/dL — ABNORMAL HIGH (ref 2.5–4.6)

## 2020-09-05 LAB — BASIC METABOLIC PANEL
Anion gap: 14 (ref 5–15)
BUN: 67 mg/dL — ABNORMAL HIGH (ref 8–23)
CO2: 27 mmol/L (ref 22–32)
Calcium: 9.2 mg/dL (ref 8.9–10.3)
Chloride: 93 mmol/L — ABNORMAL LOW (ref 98–111)
Creatinine, Ser: 6.76 mg/dL — ABNORMAL HIGH (ref 0.61–1.24)
GFR calc non Af Amer: 8 mL/min — ABNORMAL LOW (ref >60)
Glucose, Bld: 142 mg/dL — ABNORMAL HIGH (ref 70–99)
Potassium: 6.1 mmol/L — ABNORMAL HIGH (ref 3.5–5.1)
Sodium: 134 mmol/L — ABNORMAL LOW (ref 135–145)

## 2020-09-05 LAB — GLUCOSE, CAPILLARY
Glucose-Capillary: 119 mg/dL — ABNORMAL HIGH (ref 70–99)
Glucose-Capillary: 103 mg/dL — ABNORMAL HIGH (ref 70–99)
Glucose-Capillary: 144 mg/dL — ABNORMAL HIGH (ref 70–99)

## 2020-09-05 LAB — PROCALCITONIN: Procalcitonin: 0.74 ng/mL

## 2020-09-05 MED ORDER — ALBUMIN HUMAN 25 % IV SOLN
25.0000 g | Freq: Once | INTRAVENOUS | Status: DC
  Filled 2020-09-05: qty 100

## 2020-09-05 MED ORDER — CARVEDILOL 3.125 MG PO TABS
3.1250 mg | ORAL_TABLET | Freq: Two times a day (BID) | ORAL | Status: DC
  Administered 2020-09-05 – 2020-09-06 (×2): 3.125 mg via ORAL
  Filled 2020-09-05 (×2): qty 1

## 2020-09-05 MED ORDER — MORPHINE SULFATE (PF) 2 MG/ML IV SOLN
1.0000 mg | INTRAVENOUS | Status: DC | PRN
  Administered 2020-09-05: 1 mg via INTRAVENOUS
  Filled 2020-09-05: qty 0.5
  Filled 2020-09-05: qty 1

## 2020-09-05 MED ORDER — TRAMADOL HCL 50 MG PO TABS
50.0000 mg | ORAL_TABLET | Freq: Four times a day (QID) | ORAL | Status: DC | PRN
  Administered 2020-09-05 (×2): 50 mg via ORAL
  Filled 2020-09-05 (×4): qty 1

## 2020-09-05 NOTE — Progress Notes (Signed)
SLP Cancellation Note  Patient Details Name: Frank Proctor MRN: [id number] DOB: 26-Mar-1951   Cancelled treatment:       Reason Eval/Treat Not Completed: Patient at procedure or test/unavailable (pt at HD at this time)  Pt is currently out of room at HD. Unable to see pt for evaluation. Per NSG, has been tolerating po's of Pills and meals w/ no overt s/s of aspiration noted. No decline in Pulmonary status per MD note. ST services will continue to follow pt for any acute needs. NSG updated. Recommend general aspiration precautions w/ all oral intake.     Orinda Kenner, MS, CCC-SLP Speech Language Pathologist Rehab Services [phone] [facility] 09/05/2020, 11:52 AM

## 2020-09-05 NOTE — Progress Notes (Signed)
OT Cancellation Note  Patient Details Name: Frank Proctor MRN: [id number] DOB: [birth year]   Cancelled Treatment:    Reason Eval/Treat Not Completed: Medical issues which prohibited therapy. OT order received and chart reviewed. Pt currently at dialysis. Pt also has K+ value of 6.1 which is contraindicated for OT intervention. OT will re-attempt when pt is medically able to participate.   Darleen Crocker, MS, OTR/L , CBIS ascom [phone]  09/05/20, 1:40 PM   09/05/2020, 1:38 PM

## 2020-09-05 NOTE — Progress Notes (Signed)
Pt returned from dialysis

## 2020-09-05 NOTE — Progress Notes (Signed)
PROGRESS NOTE    Frank Proctor  YDX:[id number] DOB: 1951/05/22 DOA: 09/02/2020 PCP: [facility]  Assessment & Plan:   Principal Problem:   Multifocal pneumonia Active Problems:   Demand ischemia ([place])   Pressure injury of skin   Non-ST elevation (NSTEMI) myocardial infarction ([place])   Acute HFrEF (heart failure with reduced ejection fraction) ([place])   NSTEMI:troponins are elevated. Cardiac cath showed total occlusion of proximal LAD & 90% proximal RCA lesion, s/p DES.  Echo showed EF chronic 35-30%, grade II diastolic dysfunction, severe pulmonary HTN. Nitro prn. Continue on aspirin, plavix & statin   Acute systolic heart failure: volume management w/ HD as per nephro.   Transaminitis: improving. Will monitor closely while on statin   Recent pneumonia: CXR shows b/l infiltrates. COVID19 neg. Continue on ceftriaxone, azithromycin. Procal is elevated.  Chronic respiratory failure: secondary to COPD.No sign of acute exacerbation. Continue on supplemental oxygen   ESRD: on HD MWF. HD as per nephro   Hyperkalemia: will managed w/ HD. Secondary to ESRD  Thrombocytopenia: etiology unclear. Will continue to monitor   DM1: well controlled. HbA1c 7.2. Continue on SSI w/ accuchecks   HTN: continue on home dose of coreg   DVT prophylaxis: heparin  Code Status: DNR Family Communication:  Disposition Plan: likely back to home facility   Consultants:   nephro   Cardio    Procedures:    Antimicrobials: azithromycin, ceftriaxone    Subjective: Pt c/o fatigue   Objective: Vitals:   09/04/20 1830 09/04/20 2007 09/05/20 0248 09/05/20 0824  BP: (!) 120/58 (!) 141/73 (!) 124/55 120/68  Pulse: 65 73 74 76  Resp:  17 18 18   Temp: 97.6 F (36.4 C) 98 F (36.7 C) 97.6 F (36.4 C) 98 F (36.7 C)  TempSrc: Oral   Oral  SpO2: 100% 92% 95% 94%  Weight:   68.4 kg   Height:        Intake/Output Summary (Last 24 hours) at 09/05/2020 0827 Last data  filed at 09/04/2020 1838 Gross per 24 hour  Intake 366.64 ml  Output 0 ml  Net 366.64 ml   Filed Weights   09/04/20 0444 09/04/20 1227 09/05/20 0248  Weight: 66.9 kg 66.9 kg 68.4 kg    Examination:  General exam: Appears calm and comfortable. Frail appearing  Respiratory system: Clear to auscultation. Respiratory effort normal. Cardiovascular system: S1 & S2 +. No rubs, gallops or clicks. . Gastrointestinal system: Abdomen is nondistended, soft and nontender.  Normal bowel sounds heard. Central nervous system: Alert and oriented.  Psychiatry: Judgement and insight appear normal. Flat mood and affect     Data Reviewed: I have personally reviewed following labs and imaging studies  CBC: Recent Labs  Lab 09/02/20 1438 09/04/20 0329  WBC 9.2 7.6  HGB 10.7* 10.6*  HCT 32.3* 31.2*  MCV 96.7 95.1  PLT 134* 720*   Basic Metabolic Panel: Recent Labs  Lab 09/02/20 1438 09/04/20 0329 09/05/20 0640  NA 143 137 134*  K 5.1 5.7* 6.1*  CL 99 97* 93*  CO2 30 26 27   GLUCOSE 168* 162* 142*  BUN 51* 55* 67*  CREATININE 6.08* 6.00* 6.76*  CALCIUM 9.1 8.8* 9.2   GFR: Estimated Creatinine Clearance: 9.8 mL/min (A) (by C-G formula based on SCr of 6.76 mg/dL (H)). Liver Function Tests: Recent Labs  Lab 09/02/20 1438 09/04/20 0329  AST 179* 74*  ALT 202* 138*  ALKPHOS 206* 202*  BILITOT 1.1 0.9  PROT 6.3* 5.8*  ALBUMIN 3.2* 3.0*   Recent Labs  Lab 09/02/20 1438  LIPASE 37   No results for input(s): AMMONIA in the last 168 hours. Coagulation Profile: Recent Labs  Lab 09/02/20 1619  INR 1.2   Cardiac Enzymes: No results for input(s): CKTOTAL, CKMB, CKMBINDEX, TROPONINI in the last 168 hours. BNP (last 3 results) No results for input(s): PROBNP in the last 8760 hours. HbA1C: Recent Labs    09/03/20 0520  HGBA1C 7.2*   CBG: Recent Labs  Lab 09/04/20 1147 09/04/20 1622 09/04/20 1839 09/04/20 2008 09/05/20 0824  GLUCAP 100* 84 115* 115* 119*   Lipid  Profile: Recent Labs    09/03/20 0520  CHOL 55  HDL 37*  LDLCALC 15  TRIG 17  CHOLHDL 1.5   Thyroid Function Tests: No results for input(s): TSH, T4TOTAL, FREET4, T3FREE, THYROIDAB in the last 72 hours. Anemia Panel: No results for input(s): VITAMINB12, FOLATE, FERRITIN, TIBC, IRON, RETICCTPCT in the last 72 hours. Sepsis Labs: Recent Labs  Lab 09/02/20 1619 09/04/20 0329 09/05/20 0640  PROCALCITON 1.08 0.82 0.74    Recent Results (from the past 240 hour(s))  Respiratory Panel by RT PCR (Flu A&B, Covid) - Nasopharyngeal Swab     Status: None   Collection Time: 09/02/20  3:06 PM   Specimen: Nasopharyngeal Swab  Result Value Ref Range Status   SARS Coronavirus 2 by RT PCR NEGATIVE NEGATIVE Final    Comment: (NOTE) SARS-CoV-2 target nucleic acids are NOT DETECTED.  The SARS-CoV-2 RNA is generally detectable in upper respiratoy specimens during the acute phase of infection. The lowest concentration of SARS-CoV-2 viral copies this assay can detect is 131 copies/mL. A negative result does not preclude SARS-Cov-2 infection and should not be used as the sole basis for treatment or other patient management decisions. A negative result may occur with  improper specimen collection/handling, submission of specimen other than nasopharyngeal swab, presence of viral mutation(s) within the areas targeted by this assay, and inadequate number of viral copies (<131 copies/mL). A negative result must be combined with clinical observations, patient history, and epidemiological information. The expected result is Negative.  Fact Sheet for Patients:  PinkCheek.be  Fact Sheet for Healthcare Providers:  GravelBags.it  This test is no t yet approved or cleared by the [place] FDA and  has been authorized for detection and/or diagnosis of SARS-CoV-2 by FDA under an Emergency Use Authorization (EUA). This EUA will remain  in  effect (meaning this test can be used) for the duration of the COVID-19 declaration under Section 564(b)(1) of the Act, 21 U.S.C. section 360bbb-3(b)(1), unless the authorization is terminated or revoked sooner.     Influenza A by PCR NEGATIVE NEGATIVE Final   Influenza B by PCR NEGATIVE NEGATIVE Final    Comment: (NOTE) The Xpert Xpress SARS-CoV-2/FLU/RSV assay is intended as an aid in  the diagnosis of influenza from Nasopharyngeal swab specimens and  should not be used as a sole basis for treatment. Nasal washings and  aspirates are unacceptable for Xpert Xpress SARS-CoV-2/FLU/RSV  testing.  Fact Sheet for Patients: PinkCheek.be  Fact Sheet for Healthcare Providers: GravelBags.it  This test is not yet approved or cleared by the [place] FDA and  has been authorized for detection and/or diagnosis of SARS-CoV-2 by  FDA under an Emergency Use Authorization (EUA). This EUA will remain  in effect (meaning this test can be used) for the duration of the  Covid-19 declaration under Section 564(b)(1) of the Act, 21  U.S.C. section 360bbb-3(b)(1),  unless the authorization is  terminated or revoked. Performed at [facility], [address], [place], [place] [address]          Radiology Studies: CARDIAC CATHETERIZATION  Result Date: 09/04/2020 Conclusions: 1. Severe two-vessel coronary artery disease with chronic total occlusion of the proximal LAD and 90% stenosis of the proximal RCA. 2. Mild to moderate, nonobstructive coronary artery disease involving ramus intermedius and LCx. 3. Mildly elevated left heart filling pressures. 4. Severely elevated right heart filling pressures. 5. Moderate pulmonary hypertension. 6. Normal to mildly reduced cardiac output/index.  I suspect Fick cardiac output is artifactually high secondary to AV fistula. 7. Successful PCI to proximal RCA using Resolute Onyx 3.0 x 22 mm  drug-eluting stent with 0% residual stenosis and TIMI-3 flow. Recommendations: 1. Dual antiplatelet therapy with aspirin and clopidogrel for at least 12 months. 2. Aggressive secondary prevention of coronary artery disease and addition of goal-directed medical therapy for severe systolic heart failure due to ischemic cardiomyopathy, as heart rate and blood pressure tolerate. 3. Recommend medical therapy for chronic total occlusion of LAD.  If LVEF does not improve with medical therapy, viability study should be considered to determine if revascularization of the LAD territory may be beneficial. 4. Consider gentle escalation of fluid removal with hemodialysis. 5. Consider outpatient referral to the advanced heart failure team in [facility] for further management of biventricular heart failure and moderate pulmonary hypertension. Nelva Bush, MD [facility]   ECHOCARDIOGRAM COMPLETE  Result Date: 09/03/2020    ECHOCARDIOGRAM REPORT   Patient Name:   Frank Proctor Date of Exam: 09/03/2020 Medical Rec #:  [id number]        Height:       68.0 in Accession #:    [id number]       Weight:       146.0 lb Date of Birth:  Sep 13, 1951       BSA:          1.788 m Patient Age:    69 years         BP:           98/41 mmHg Patient Gender: M                HR:           58 bpm. Exam Location:  [facility] Procedure: 2D Echo, Cardiac Doppler and Color Doppler Indications:     Acute coronary syndrome 124.9  History:         Patient has no prior history of Echocardiogram examinations.                  Risk Factors:Diabetes.  Sonographer:     Sherrie Sport RDCS (AE) Referring Phys:  [id number] [facility] AMIN Diagnosing Phys: Kathlyn Sacramento MD IMPRESSIONS  1. Left ventricular ejection fraction, by estimation, is 25 to 30%. The left ventricle has severely decreased function. The left ventricle demonstrates global hypokinesis. There is mild left ventricular hypertrophy. Left ventricular diastolic parameters  are consistent with Grade II  diastolic dysfunction (pseudonormalization).  2. Right ventricular systolic function is normal. The right ventricular size is normal. There is severely elevated pulmonary artery systolic pressure. The estimated right ventricular systolic pressure is 59.9 mmHg.  3. Left atrial size was moderately dilated.  4. Right atrial size was moderately dilated.  5. Moderate pleural effusion in the left lateral region.  6. The mitral valve is normal in structure. Mild mitral valve regurgitation. No evidence of mitral stenosis. Moderate  mitral annular calcification.  7. Tricuspid valve regurgitation is moderate.  8. The aortic valve is normal in structure. Aortic valve regurgitation is not visualized. Mild aortic valve sclerosis is present, with no evidence of aortic valve stenosis.  9. Moderately dilated pulmonary artery. 10. The inferior vena cava is dilated in size with >50% respiratory variability, suggesting right atrial pressure of 8 mmHg. FINDINGS  Left Ventricle: Left ventricular ejection fraction, by estimation, is 25 to 30%. The left ventricle has severely decreased function. The left ventricle demonstrates global hypokinesis. The left ventricular internal cavity size was normal in size. There is mild left ventricular hypertrophy. Left ventricular diastolic parameters are consistent with Grade II diastolic dysfunction (pseudonormalization). Right Ventricle: The right ventricular size is normal. No increase in right ventricular wall thickness. Right ventricular systolic function is normal. There is severely elevated pulmonary artery systolic pressure. The tricuspid regurgitant velocity is 3.83 m/s, and with an assumed right atrial pressure of 8 mmHg, the estimated right ventricular systolic pressure is 62.8 mmHg. Left Atrium: Left atrial size was moderately dilated. Right Atrium: Right atrial size was moderately dilated. Pericardium: There is no evidence of pericardial effusion. Mitral Valve: The mitral valve is normal  in structure. Moderate mitral annular calcification. Mild mitral valve regurgitation. No evidence of mitral valve stenosis. Tricuspid Valve: The tricuspid valve is normal in structure. Tricuspid valve regurgitation is moderate . No evidence of tricuspid stenosis. Aortic Valve: The aortic valve is normal in structure. Aortic valve regurgitation is not visualized. Mild aortic valve sclerosis is present, with no evidence of aortic valve stenosis. Aortic valve mean gradient measures 3.0 mmHg. Aortic valve peak gradient measures 5.4 mmHg. Aortic valve area, by VTI measures 1.83 cm. Pulmonic Valve: The pulmonic valve was normal in structure. Pulmonic valve regurgitation is mild. No evidence of pulmonic stenosis. Aorta: The aortic root is normal in size and structure. Pulmonary Artery: The pulmonary artery is moderately dilated. Venous: The inferior vena cava is dilated in size with greater than 50% respiratory variability, suggesting right atrial pressure of 8 mmHg. IAS/Shunts: No atrial level shunt detected by color flow Doppler. Additional Comments: There is a moderate pleural effusion in the left lateral region.  LEFT VENTRICLE PLAX 2D LVIDd:         5.43 cm     Diastology LVIDs:         4.82 cm     LV e' medial:    5.66 cm/s LV PW:         1.15 cm     LV E/e' medial:  18.9 LV IVS:        1.13 cm     LV e' lateral:   7.40 cm/s LVOT diam:     2.00 cm     LV E/e' lateral: 14.5 LV SV:         50 LV SV Index:   28 LVOT Area:     3.14 cm  LV Volumes (MOD) LV vol d, MOD A2C: 80.4 ml LV vol d, MOD A4C: 67.1 ml LV vol s, MOD A2C: 56.6 ml LV vol s, MOD A4C: 50.9 ml LV SV MOD A2C:     23.8 ml LV SV MOD A4C:     67.1 ml LV SV MOD BP:      22.9 ml RIGHT VENTRICLE RV Basal diam:  4.57 cm RV S prime:     9.46 cm/s TAPSE (M-mode): 4.3 cm LEFT ATRIUM  Index       RIGHT ATRIUM           Index LA diam:        4.10 cm  2.29 cm/m  RA Area:     32.70 cm LA Vol (A2C):   122.0 ml 68.24 ml/m RA Volume:   121.00 ml 67.68 ml/m  LA Vol (A4C):   45.9 ml  25.67 ml/m LA Biplane Vol: 78.7 ml  44.02 ml/m  AORTIC VALVE                   PULMONIC VALVE AV Area (Vmax):    1.56 cm    PV Vmax:        0.54 m/s AV Area (Vmean):   1.70 cm    PV Peak grad:   1.2 mmHg AV Area (VTI):     1.83 cm    RVOT Peak grad: 3 mmHg AV Vmax:           116.33 cm/s AV Vmean:          78.200 cm/s AV VTI:            0.273 m AV Peak Grad:      5.4 mmHg AV Mean Grad:      3.0 mmHg LVOT Vmax:         57.60 cm/s LVOT Vmean:        42.200 cm/s LVOT VTI:          0.159 m LVOT/AV VTI ratio: 0.58  AORTA Ao Root diam: 3.00 cm MITRAL VALVE                TRICUSPID VALVE MV Area (PHT): 3.53 cm     TR Peak grad:   58.7 mmHg MV Decel Time: 215 msec     TR Vmax:        383.00 cm/s MV E velocity: 107.00 cm/s MV A velocity: 98.00 cm/s   SHUNTS MV E/A ratio:  1.09         Systemic VTI:  0.16 m                             Systemic Diam: 2.00 cm Kathlyn Sacramento MD Electronically signed by Kathlyn Sacramento MD Signature Date/Time: 09/03/2020/10:07:47 AM    Final         Scheduled Meds: . aspirin EC  81 mg Oral Daily  . atorvastatin  5 mg Oral Daily  . carvedilol  3.125 mg Oral BID WC  . Chlorhexidine Gluconate Cloth  6 each Topical Q0600  . clopidogrel  75 mg Oral Q breakfast  . ferrous sulfate  325 mg Oral BID WC  . gabapentin  300 mg Oral QPM  . heparin  5,000 Units Subcutaneous Q8H  . influenza vaccine adjuvanted  0.5 mL Intramuscular Tomorrow-1000  . insulin aspart  0-5 Units Subcutaneous QHS  . insulin aspart  0-6 Units Subcutaneous TID WC  . lidocaine  1 patch Transdermal Daily  . sevelamer carbonate  2,400 mg Oral TID WC  . sodium chloride flush  3 mL Intravenous Q12H   Continuous Infusions: . sodium chloride    . sodium chloride    . sodium chloride    . azithromycin Stopped (09/03/20 2247)  . cefTRIAXone (ROCEPHIN)  IV 1 g (09/03/20 2301)     LOS: 3 days    Time spent: 33 mins    Wyvonnia Dusky, MD [facility] Pager  [phone]  [phone]  If 7PM-7AM, please contact night-coverage [email or web address] 09/05/2020, 8:27 AM

## 2020-09-05 NOTE — Progress Notes (Signed)
[facility]  ROUNDING NOTE   Subjective:   [name] is a 69 years old gentleman known to our practice. He came to ED  On 09/03/20 with SOB and chest pain.Patient was found to be fluid overloaded,dialysed him on admission. Patient receiving dialysis treatment today, tolerating well.  Blood pressure readings staying within low normal range.   Objective:  Vital signs in last 24 hours:  Temp:  [97.6 F (36.4 C)-98.9 F (37.2 C)] 98.9 F (37.2 C) (10/06 1100) Pulse Rate:  [58-76] 76 (10/06 0824) Resp:  [11-20] 18 (10/06 0824) BP: (90-141)/(48-73) 122/57 (10/06 1315) SpO2:  [90 %-100 %] 94 % (10/06 0824) Weight:  [68.4 kg] 68.4 kg (10/06 0248)  Weight change: 0.04 kg Filed Weights   09/04/20 0444 09/04/20 1227 09/05/20 0248  Weight: 66.9 kg 66.9 kg 68.4 kg    Intake/Output: I/O last 3 completed shifts: In: 659.1 [I.V.:409.1; IV Piggyback:250] Out: 0    Intake/Output this shift:  No intake/output data recorded.  Physical Exam: General:  Lying in bed receiving dialysis treatment  Head:  Normocephalic, atraumatic, moist oral mucosal membranes  Eyes: Anicteric  Neck: Supple, trachea at midline  Lungs:   Normal and symmetric respiratory effort, lungs diminished at the bases, on O2 2 L via nasal cannula  Heart:  Regular, S1S2 no rubs or gallops  Abdomen:   Nondistended, nontender  Extremities:  Bilateral BKA, 1+ peripheral edema on thighs  Neurologic:  Sleeping, arousable to call, oriented  Skin: No acute rashes or lesions  Access:  Right upper extremity AV fistula +bruit,+ thrill    Basic Metabolic Panel: Recent Labs  Lab 09/02/20 1438 09/04/20 0329 09/05/20 0640 09/05/20 1100  NA 143 137 134*  --   K 5.1 5.7* 6.1*  --   CL 99 97* 93*  --   CO2 30 26 27   --   GLUCOSE 168* 162* 142*  --   BUN 51* 55* 67*  --   CREATININE 6.08* 6.00* 6.76*  --   CALCIUM 9.1 8.8* 9.2  --   PHOS  --   --   --  7.2*    Liver Function Tests: Recent Labs  Lab  09/02/20 1438 09/04/20 0329  AST 179* 74*  ALT 202* 138*  ALKPHOS 206* 202*  BILITOT 1.1 0.9  PROT 6.3* 5.8*  ALBUMIN 3.2* 3.0*   Recent Labs  Lab 09/02/20 1438  LIPASE 37   No results for input(s): AMMONIA in the last 168 hours.  CBC: Recent Labs  Lab 09/02/20 1438 09/04/20 0329  WBC 9.2 7.6  HGB 10.7* 10.6*  HCT 32.3* 31.2*  MCV 96.7 95.1  PLT 134* 123*    Cardiac Enzymes: No results for input(s): CKTOTAL, CKMB, CKMBINDEX, TROPONINI in the last 168 hours.  BNP: Invalid input(s): POCBNP  CBG: Recent Labs  Lab 09/04/20 1147 09/04/20 1622 09/04/20 1839 09/04/20 2008 09/05/20 0824  GLUCAP 100* 84 115* 115* 119*    Microbiology: Results for orders placed or performed during the hospital encounter of 09/02/20  Respiratory Panel by RT PCR (Flu A&B, Covid) - Nasopharyngeal Swab     Status: None   Collection Time: 09/02/20  3:06 PM   Specimen: Nasopharyngeal Swab  Result Value Ref Range Status   SARS Coronavirus 2 by RT PCR NEGATIVE NEGATIVE Final    Comment: (NOTE) SARS-CoV-2 target nucleic acids are NOT DETECTED.  The SARS-CoV-2 RNA is generally detectable in upper respiratoy specimens during the acute phase of infection. The lowest concentration  of SARS-CoV-2 viral copies this assay can detect is 131 copies/mL. A negative result does not preclude SARS-Cov-2 infection and should not be used as the sole basis for treatment or other patient management decisions. A negative result may occur with  improper specimen collection/handling, submission of specimen other than nasopharyngeal swab, presence of viral mutation(s) within the areas targeted by this assay, and inadequate number of viral copies (<131 copies/mL). A negative result must be combined with clinical observations, patient history, and epidemiological information. The expected result is Negative.  Fact Sheet for Patients:  PinkCheek.be  Fact Sheet for Healthcare  Providers:  GravelBags.it  This test is no t yet approved or cleared by the [place] FDA and  has been authorized for detection and/or diagnosis of SARS-CoV-2 by FDA under an Emergency Use Authorization (EUA). This EUA will remain  in effect (meaning this test can be used) for the duration of the COVID-19 declaration under Section 564(b)(1) of the Act, 21 U.S.C. section 360bbb-3(b)(1), unless the authorization is terminated or revoked sooner.     Influenza A by PCR NEGATIVE NEGATIVE Final   Influenza B by PCR NEGATIVE NEGATIVE Final    Comment: (NOTE) The Xpert Xpress SARS-CoV-2/FLU/RSV assay is intended as an aid in  the diagnosis of influenza from Nasopharyngeal swab specimens and  should not be used as a sole basis for treatment. Nasal washings and  aspirates are unacceptable for Xpert Xpress SARS-CoV-2/FLU/RSV  testing.  Fact Sheet for Patients: PinkCheek.be  Fact Sheet for Healthcare Providers: GravelBags.it  This test is not yet approved or cleared by the [place] FDA and  has been authorized for detection and/or diagnosis of SARS-CoV-2 by  FDA under an Emergency Use Authorization (EUA). This EUA will remain  in effect (meaning this test can be used) for the duration of the  Covid-19 declaration under Section 564(b)(1) of the Act, 21  U.S.C. section 360bbb-3(b)(1), unless the authorization is  terminated or revoked. Performed at [facility], [address] [place], [place] [address]     Coagulation Studies: Recent Labs    09/02/20 1619  LABPROT 14.5  INR 1.2    Urinalysis: No results for input(s): COLORURINE, LABSPEC, PHURINE, GLUCOSEU, HGBUR, BILIRUBINUR, KETONESUR, PROTEINUR, UROBILINOGEN, NITRITE, LEUKOCYTESUR in the last 72 hours.  Invalid input(s): APPERANCEUR    Imaging: CARDIAC CATHETERIZATION  Result Date: 09/04/2020 Conclusions: 1. Severe  two-vessel coronary artery disease with chronic total occlusion of the proximal LAD and 90% stenosis of the proximal RCA. 2. Mild to moderate, nonobstructive coronary artery disease involving ramus intermedius and LCx. 3. Mildly elevated left heart filling pressures. 4. Severely elevated right heart filling pressures. 5. Moderate pulmonary hypertension. 6. Normal to mildly reduced cardiac output/index.  I suspect Fick cardiac output is artifactually high secondary to AV fistula. 7. Successful PCI to proximal RCA using Resolute Onyx 3.0 x 22 mm drug-eluting stent with 0% residual stenosis and TIMI-3 flow. Recommendations: 1. Dual antiplatelet therapy with aspirin and clopidogrel for at least 12 months. 2. Aggressive secondary prevention of coronary artery disease and addition of goal-directed medical therapy for severe systolic heart failure due to ischemic cardiomyopathy, as heart rate and blood pressure tolerate. 3. Recommend medical therapy for chronic total occlusion of LAD.  If LVEF does not improve with medical therapy, viability study should be considered to determine if revascularization of the LAD territory may be beneficial. 4. Consider gentle escalation of fluid removal with hemodialysis. 5. Consider outpatient referral to the advanced heart failure team in [facility]  for further management of biventricular heart failure and moderate pulmonary hypertension. Nelva Bush, MD [facility]     Medications:   . sodium chloride    . sodium chloride    . sodium chloride    . albumin human Stopped (09/05/20 1341)  . azithromycin Stopped (09/03/20 2247)  . cefTRIAXone (ROCEPHIN)  IV 1 g (09/03/20 2301)   . aspirin EC  81 mg Oral Daily  . atorvastatin  5 mg Oral Daily  . carvedilol  3.125 mg Oral BID WC  . Chlorhexidine Gluconate Cloth  6 each Topical Q0600  . clopidogrel  75 mg Oral Q breakfast  . ferrous sulfate  325 mg Oral BID WC  . gabapentin  300 mg Oral QPM  . heparin  5,000 Units  Subcutaneous Q8H  . influenza vaccine adjuvanted  0.5 mL Intramuscular Tomorrow-1000  . insulin aspart  0-5 Units Subcutaneous QHS  . insulin aspart  0-6 Units Subcutaneous TID WC  . lidocaine  1 patch Transdermal Daily  . sevelamer carbonate  2,400 mg Oral TID WC  . sodium chloride flush  3 mL Intravenous Q12H   sodium chloride, sodium chloride, sodium chloride, acetaminophen, alteplase, hydroxypropyl methylcellulose / hypromellose, lidocaine (PF), lidocaine-prilocaine, menthol-cetylpyridinium, morphine injection, nitroGLYCERIN, ondansetron (ZOFRAN) IV, pentafluoroprop-tetrafluoroeth, sodium chloride flush, traMADol  Assessment/ Plan:  [name] is a 69 y.o.  male old gentleman well known to our practice. He cameto ED today with SOB and chest pain.Patient was found to be fluid overloaded.   ESRD on dialysis MWF Right upper arm AV fistula Patient is getting dialyzed today, ultrafiltration goal 1 to 1.5 L as tolerated Chest pain/shortness of breath/ACS Patient underwent right and left heart catheterization on 09/04/2020, showed severe two-vessel CAD, placed PCI/DES to RCA. Cardiology following up  Hypoxic respiratory failure  Chest x-ray on 09/02/2020 Bilateral airspace opacities, concerning for multifocal pneumonia and her small left pleural effusion 2D echo on 09/03/2020 Left ventricular ejection fraction 25 to 30% Right and left Heart catheterizations on 09/04/2020 Two-vessel coronary artery disease, intervention was done on 09/04/2020 Mildly elevated left heart filling pressure Severely elevated right heart filling pressures Moderate pulmonary hypertension Cardiology  And medicine team managing  Hypertension Blood pressure readings is at low normal range,systolic BP 16'X to low 450'T. IV albumin given during dialysis      LOS: 3 Frank Proctor 10/6/20212:46 PM

## 2020-09-05 NOTE — Progress Notes (Signed)
Progress Note  Patient Name: Frank Proctor Date of Encounter: 09/05/2020  Primary Cardiologist: New to [facility] - consult by Fletcher Anon  Subjective   Status post [facility] 10/5 that showed severe 2-vessel CAD with CTO of the proximal LAD and 90% stenosis of the proximal RCA. He underwent successful PCI/DES to the proximal RCA. There was residual mild to moderate nonobstructive disease involving the ramus and LCx. Mildly elevated left heart and severely elevated right heart filling pressures with moderate pulmonary hypertension and normal to mildly reduced cardiac output/index with output possibly falsely high secondary to AV fistula.   No further chest pain. No dyspnea, palpitations, dizziness, presyncope, or syncope. Potassium 5.7-->6.1 this morning. He is for HD today.   Inpatient Medications    Scheduled Meds: . aspirin EC  81 mg Oral Daily  . atorvastatin  5 mg Oral Daily  . Chlorhexidine Gluconate Cloth  6 each Topical Q0600  . clopidogrel  75 mg Oral Q breakfast  . ferrous sulfate  325 mg Oral BID WC  . gabapentin  300 mg Oral QPM  . heparin  5,000 Units Subcutaneous Q8H  . influenza vaccine adjuvanted  0.5 mL Intramuscular Tomorrow-1000  . insulin aspart  0-5 Units Subcutaneous QHS  . insulin aspart  0-6 Units Subcutaneous TID WC  . lidocaine  1 patch Transdermal Daily  . sevelamer carbonate  2,400 mg Oral TID WC  . sodium chloride flush  3 mL Intravenous Q12H   Continuous Infusions: . sodium chloride    . sodium chloride    . sodium chloride    . azithromycin Stopped (09/03/20 2247)  . cefTRIAXone (ROCEPHIN)  IV 1 g (09/03/20 2301)   PRN Meds: sodium chloride, sodium chloride, sodium chloride, acetaminophen, alteplase, hydroxypropyl methylcellulose / hypromellose, lidocaine (PF), lidocaine-prilocaine, menthol-cetylpyridinium, nitroGLYCERIN, ondansetron (ZOFRAN) IV, pentafluoroprop-tetrafluoroeth, sodium chloride flush   Vital Signs    Vitals:   09/04/20 1800 09/04/20  1830 09/04/20 2007 09/05/20 0248  BP: (!) 109/52 (!) 120/58 (!) 141/73 (!) 124/55  Pulse: 61 65 73 74  Resp: 11  17 18   Temp:  97.6 F (36.4 C) 98 F (36.7 C) 97.6 F (36.4 C)  TempSrc:  Oral    SpO2: 92% 100% 92% 95%  Weight:    68.4 kg  Height:        Intake/Output Summary (Last 24 hours) at 09/05/2020 0754 Last data filed at 09/04/2020 1838 Gross per 24 hour  Intake 366.64 ml  Output 0 ml  Net 366.64 ml   Filed Weights   09/04/20 0444 09/04/20 1227 09/05/20 0248  Weight: 66.9 kg 66.9 kg 68.4 kg    Telemetry    SR, 60s bpm - Personally Reviewed  ECG    Post PCI EKG with NSR, 61 bpm, prior anteroseptal infarct, no acute st/t changes - Personally Reviewed  Physical Exam   GEN: No acute distress.   Neck: No JVD. Cardiac: RRR, no murmurs, rubs, or gallops. Right femoral cardiac cath site is without bleeding, bruising, swelling, warmth, erythema, or TTP. No bruit.  Respiratory: Diminished breath sounds bilaterally.  GI: Soft, nontender, non-distended.   MS: No stump edema; Status post bilateral BKA. Neuro:  Alert and oriented x 3; Nonfocal.  Psych: Normal affect.  Labs    Chemistry Recent Labs  Lab 09/02/20 1438 09/04/20 0329 09/05/20 0640  NA 143 137 134*  K 5.1 5.7* 6.1*  CL 99 97* 93*  CO2 30 26 27   GLUCOSE 168* 162* 142*  BUN 51* 55*  67*  CREATININE 6.08* 6.00* 6.76*  CALCIUM 9.1 8.8* 9.2  PROT 6.3* 5.8*  --   ALBUMIN 3.2* 3.0*  --   AST 179* 74*  --   ALT 202* 138*  --   ALKPHOS 206* 202*  --   BILITOT 1.1 0.9  --   GFRNONAA 9* 9* 8*  GFRAA 10* 10*  --   ANIONGAP 14 14 14      Hematology Recent Labs  Lab 09/02/20 1438 09/04/20 0329  WBC 9.2 7.6  RBC 3.34* 3.28*  HGB 10.7* 10.6*  HCT 32.3* 31.2*  MCV 96.7 95.1  MCH 32.0 32.3  MCHC 33.1 34.0  RDW 17.8* 17.5*  PLT 134* 123*    Cardiac EnzymesNo results for input(s): TROPONINI in the last 168 hours. No results for input(s): TROPIPOC in the last 168 hours.   BNP Recent Labs  Lab  09/02/20 1438  BNP >4,500.0*     DDimer No results for input(s): DDIMER in the last 168 hours.   Radiology    DG Chest Port 1 View  Result Date: 09/02/2020 IMPRESSION: 1. Patchy bilateral airspace opacities with dense consolidation at the left lung base. Findings are concerning for multifocal pneumonia. Recommend follow-up to resolution. 2. Possible layering small left pleural effusion. 3. Mild cardiomegaly. Electronically Signed   By: Margaretha Sheffield MD   On: 09/02/2020 14:46    Cardiac Studies   2D echo 09/03/2020: 1. Left ventricular ejection fraction, by estimation, is 25 to 30%. The  left ventricle has severely decreased function. The left ventricle  demonstrates global hypokinesis. There is mild left ventricular  hypertrophy. Left ventricular diastolic parameters  are consistent with Grade II diastolic dysfunction (pseudonormalization).  2. Right ventricular systolic function is normal. The right ventricular  size is normal. There is severely elevated pulmonary artery systolic  pressure. The estimated right ventricular systolic pressure is 69.6 mmHg.  3. Left atrial size was moderately dilated.  4. Right atrial size was moderately dilated.  5. Moderate pleural effusion in the left lateral region.  6. The mitral valve is normal in structure. Mild mitral valve  regurgitation. No evidence of mitral stenosis. Moderate mitral annular  calcification.  7. Tricuspid valve regurgitation is moderate.  8. The aortic valve is normal in structure. Aortic valve regurgitation is  not visualized. Mild aortic valve sclerosis is present, with no evidence  of aortic valve stenosis.  9. Moderately dilated pulmonary artery.  10. The inferior vena cava is dilated in size with >50% respiratory  variability, suggesting right atrial pressure of 8 mmHg.  __________  [facility] 09/04/2020: Conclusions: 1. Severe two-vessel coronary artery disease with chronic total occlusion of the proximal  LAD and 90% stenosis of the proximal RCA. 2. Mild to moderate, nonobstructive coronary artery disease involving ramus intermedius and LCx. 3. Mildly elevated left heart filling pressures. 4. Severely elevated right heart filling pressures. 5. Moderate pulmonary hypertension. 6. Normal to mildly reduced cardiac output/index.  I suspect Fick cardiac output is artifactually high secondary to AV fistula. 7. Successful PCI to proximal RCA using Resolute Onyx 3.0 x 22 mm drug-eluting stent with 0% residual stenosis and TIMI-3 flow.  Recommendations: 1. Dual antiplatelet therapy with aspirin and clopidogrel for at least 12 months. 2. Aggressive secondary prevention of coronary artery disease and addition of goal-directed medical therapy for severe systolic heart failure due to ischemic cardiomyopathy, as heart rate and blood pressure tolerate. 3. Recommend medical therapy for chronic total occlusion of LAD.  If LVEF does not improve  with medical therapy, viability study should be considered to determine if revascularization of the LAD territory may be beneficial. 4. Consider gentle escalation of fluid removal with hemodialysis. 5. Consider outpatient referral to the advanced heart failure team in [facility] for further management of biventricular heart failure and moderate pulmonary hypertension.   Patient Profile     69 y.o. 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  Assessment  & Plan    1. NSTEMI: -Status post R/LHC 10/5 with severe 2-vessel CAD with CTO of the LAD and 90% stenosis of the proximal RCA s/p PCI/DES -Medical therapy for CTO of the LAD, if his EF does not improve with maximally tolerated GDMT, viability study could be beneficial  -Currently, chest pain free  -Peak HS-Tn of 600 -DAPT with ASA and Plavix without interruption for at least the next 12 months  -Aggressive risk factor modification with escalation of GDMT -Add Coreg as below -Lipitor  -Post cath instructions -Cardiac rehab  2. Acute HFrEF secondary to ICM: -He continues to be volume up with fluid management per HD, consider escalation of fluid removal to assist with volume status and symptoms  -R/LHC as above -Relative hypotension has previously precluded addition of GDMT -BP is better this morning, add Coreg 3.125 mg bid -As/if able escalate GDMT including possibly Imdur/hydralazine  -Not on ACEi/ARB/spironolactone with ESRD and hyperkalemia  -Daily weights  3. ESRD: -HD per nephrology   4. Hyperkalemia: -Management per nephrology with HD  5. Chronic hypoxic respiratory failure/COPD: -Stable -Per IM  6. HTN: -BP improved -Add low dose Coreg as above  7. Anemia of chronic disease/thrombocytopenia: -Hgb and PLT appear stable    For questions or updates, please contact [place] Please consult [email or web address] for contact info under Cardiology/STEMI.    Signed, Christell Faith, PA-C [place] Pager: [phone] 09/05/2020, 7:54 AM

## 2020-09-06 DIAGNOSIS — E1052 Type 1 diabetes mellitus with diabetic peripheral angiopathy with gangrene: Secondary | ICD-10-CM | POA: Diagnosis not present

## 2020-09-06 DIAGNOSIS — J432 Centrilobular emphysema: Secondary | ICD-10-CM

## 2020-09-06 DIAGNOSIS — I214 Non-ST elevation (NSTEMI) myocardial infarction: Secondary | ICD-10-CM | POA: Diagnosis not present

## 2020-09-06 LAB — CBC
HCT: 26.8 % — ABNORMAL LOW (ref 39.0–52.0)
Hemoglobin: 9 g/dL — ABNORMAL LOW (ref 13.0–17.0)
MCH: 31.9 pg (ref 26.0–34.0)
MCHC: 33.6 g/dL (ref 30.0–36.0)
MCV: 95 fL (ref 80.0–100.0)
Platelets: 105 10*3/uL — ABNORMAL LOW (ref 150–400)
RBC: 2.82 MIL/uL — ABNORMAL LOW (ref 4.22–5.81)
RDW: 17.5 % — ABNORMAL HIGH (ref 11.5–15.5)
WBC: 6.5 10*3/uL (ref 4.0–10.5)
nRBC: 0 % (ref 0.0–0.2)

## 2020-09-06 LAB — COMPREHENSIVE METABOLIC PANEL
ALT: 93 U/L — ABNORMAL HIGH (ref 0–44)
AST: 34 U/L (ref 15–41)
Albumin: 3 g/dL — ABNORMAL LOW (ref 3.5–5.0)
Alkaline Phosphatase: 193 U/L — ABNORMAL HIGH (ref 38–126)
Anion gap: 10 (ref 5–15)
BUN: 35 mg/dL — ABNORMAL HIGH (ref 8–23)
CO2: 33 mmol/L — ABNORMAL HIGH (ref 22–32)
Calcium: 8.8 mg/dL — ABNORMAL LOW (ref 8.9–10.3)
Chloride: 95 mmol/L — ABNORMAL LOW (ref 98–111)
Creatinine, Ser: 4.04 mg/dL — ABNORMAL HIGH (ref 0.61–1.24)
GFR calc non Af Amer: 14 mL/min — ABNORMAL LOW (ref >60)
Glucose, Bld: 78 mg/dL (ref 70–99)
Potassium: 4.5 mmol/L (ref 3.5–5.1)
Sodium: 138 mmol/L (ref 135–145)
Total Bilirubin: 1 mg/dL (ref 0.3–1.2)
Total Protein: 6 g/dL — ABNORMAL LOW (ref 6.5–8.1)

## 2020-09-06 LAB — POCT ACTIVATED CLOTTING TIME: Activated Clotting Time: 235 seconds

## 2020-09-06 LAB — GLUCOSE, CAPILLARY
Glucose-Capillary: 68 mg/dL — ABNORMAL LOW (ref 70–99)
Glucose-Capillary: 134 mg/dL — ABNORMAL HIGH (ref 70–99)
Glucose-Capillary: 96 mg/dL (ref 70–99)
Glucose-Capillary: 174 mg/dL — ABNORMAL HIGH (ref 70–99)

## 2020-09-06 LAB — RESPIRATORY PANEL BY RT PCR (FLU A&B, COVID)
Influenza A by PCR: NEGATIVE
Influenza B by PCR: NEGATIVE
SARS Coronavirus 2 by RT PCR: NEGATIVE

## 2020-09-06 LAB — PROCALCITONIN: Procalcitonin: 0.68 ng/mL

## 2020-09-06 MED ORDER — AZITHROMYCIN 250 MG PO TABS: 500.0000 mg | ORAL_TABLET | Freq: Every day | ORAL | Status: DC

## 2020-09-06 MED ORDER — AZITHROMYCIN 250 MG PO TABS: 500.0000 mg | ORAL_TABLET | Freq: Every day | ORAL | 0 refills | Status: AC

## 2020-09-06 MED ORDER — CLOPIDOGREL BISULFATE 75 MG PO TABS: 75.0000 mg | ORAL_TABLET | Freq: Every day | ORAL | 0 refills | Status: AC

## 2020-09-06 MED ORDER — CARVEDILOL 3.125 MG PO TABS: 3.1250 mg | ORAL_TABLET | Freq: Two times a day (BID) | ORAL | 0 refills | Status: AC

## 2020-09-06 NOTE — Evaluation (Signed)
Occupational Therapy Evaluation Patient Details Name: Frank Proctor MRN: [id number] DOB: 15-Mar-1951 Today's Date: 09/06/2020    History of Present Illness Pt is a 69 year old male with history of COPD on home oxygen, diabetes, anemia of chronic disease, bilateral BKA, end-stage renal disease on hemodialysis presenting with chest pain to ED, diagnosed with NSTEMI and multifocal pneumonia. Had a drug-eluting stent placed to the RCA 10/5.   Clinical Impression   Pt was seen for OT evaluation this date. Prior to hospital admission, pt has been living at [facility] for past several months. The facility is fully handicapped accessible, pt receives "some help" from staff at [facility] for dressing, bathing, toileting tasks. Currently pt demonstrates impairments as described below (See OT problem list) which functionally limit his ability to perform ADL/self-care tasks. Pt currently requires Min A for most ADL, IND in self-feeding, CGA for bed mobility. Pt states that he is interested in PT (working on walking using BLE protheses) but declines participation in OT. Upon hospital discharge, recommend pt return to [facility] to maximize pt safety and return to PLOF.     Follow Up Recommendations  SNF    Equipment Recommendations  None recommended by OT    Recommendations for Other Services       Precautions / Restrictions Precautions Precautions: Fall Precaution Comments: bilat BKA Restrictions Weight Bearing Restrictions: No      Mobility Bed Mobility Overal bed mobility: Needs Assistance Bed Mobility: Supine to Sit;Sit to Supine     Supine to sit: HOB elevated;Min guard Sit to supine: Min assist;HOB elevated   General bed mobility comments: light assist to return to supine for weight shift  Transfers Overall transfer level: Needs assistance               General transfer comment: Pt reports that he uses BLE prosthesis + RW for ambulation, but left  prosthesis at SNF    Balance Overall balance assessment: Needs assistance Sitting-balance support: Feet unsupported;Bilateral upper extremity supported Sitting balance-Leahy Scale: Good         Standing balance comment: not attempted                           ADL either performed or assessed with clinical judgement   ADL Overall ADL's : Needs assistance/impaired Eating/Feeding: Independent   Grooming: Set up;Min guard                                 General ADL Comments: Pt reports that he "has help" for most ADL at SNF where he has been living for past several months     Vision Baseline Vision/History: Wears glasses Wears Glasses: At all times Patient Visual Report: No change from baseline Additional Comments: wears glasses at all times, but left them at [facility]     Perception     Praxis      Pertinent Vitals/Pain Pain Assessment: 0-10 Pain Score: 10-Worst pain ever Pain Location: L shoulder pain, 10/10 Pain Descriptors / Indicators: Aching;Guarding;Grimacing Pain Intervention(s): Limited activity within patient's tolerance;Monitored during session;Repositioned     Hand Dominance Left   Extremity/Trunk Assessment Upper Extremity Assessment Upper Extremity Assessment: Generalized weakness   Lower Extremity Assessment Lower Extremity Assessment: Generalized weakness       Communication Communication Communication: 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)   Cognition Arousal/Alertness: Awake/alert Behavior During Therapy: WFL for tasks assessed/performed  Overall Cognitive Status: Within Functional Limits for tasks assessed                                 General Comments: Pt vacilated btwn awake and lethargic -- could be speaking clearly, then appear to fall asleep mid-sentence   General Comments       Exercises Other Exercises Other Exercises: Educ re: role of OT, EC, DM mgmt   Shoulder Instructions      Home Living Family/patient  expects to be discharged to:: Skilled nursing facility                                 Additional Comments: Pt has been at [facility] for "several months"      Prior Functioning/Environment Level of Independence: Needs assistance  Gait / Transfers Assistance Needed: pt able to don bilateral prosthetic legs and has been practicing with PT to ambulate with RW              OT Problem List: Decreased strength;Pain;Decreased range of motion;Decreased activity tolerance      OT Treatment/Interventions:      OT Goals(Current goals can be found in the care plan section) Acute Rehab OT Goals Patient Stated Goal: to get home after rehab OT Goal Formulation: With patient Time For Goal Achievement: 09/20/20 Potential to Achieve Goals: Good  OT Frequency:     Barriers to D/C:            Co-evaluation              AM-PAC OT "6 Clicks" Daily Activity     Outcome Measure Help from another person eating meals?: None Help from another person taking care of personal grooming?: A Little Help from another person toileting, which includes using toliet, bedpan, or urinal?: A Little Help from another person bathing (including washing, rinsing, drying)?: A Little Help from another person to put on and taking off regular upper body clothing?: A Little Help from another person to put on and taking off regular lower body clothing?: A Little 6 Click Score: 19   End of Session    Activity Tolerance: Patient tolerated treatment well;Patient limited by lethargy;Patient limited by pain Patient left: in bed;with call bell/phone within reach;with bed alarm set  OT Visit Diagnosis: Other abnormalities of gait and mobility (R26.89);Muscle weakness (generalized) (M62.81);Pain Pain - part of body: Shoulder;Arm (L shoulder, R elbow)                Time: 1000-1030 OT Time Calculation (min): 30 min Charges:  OT General Charges $OT Visit: 1 Visit OT Evaluation $OT Eval Moderate  Complexity: 1 Mod OT Treatments $Self Care/Home Management : 23-37 mins  Josiah Lobo, PhD, MS, OTR/L ascom [phone] 09/06/20, 4:50 PM

## 2020-09-06 NOTE — Progress Notes (Signed)
Progress Note  Patient Name: Frank Proctor Date of Encounter: 09/06/2020  Primary Cardiologist: [facility]  Subjective   Has had some MSK c/p in the setting of chronic cough, otw c/p free.  No dyspnea, though O2 sats trending in high 80's on 3lpm @ rest.  Inpatient Medications    Scheduled Meds:  aspirin EC  81 mg Oral Daily   atorvastatin  5 mg Oral Daily   carvedilol  3.125 mg Oral BID WC   Chlorhexidine Gluconate Cloth  6 each Topical Q0600   clopidogrel  75 mg Oral Q breakfast   ferrous sulfate  325 mg Oral BID WC   gabapentin  300 mg Oral QPM   heparin  5,000 Units Subcutaneous Q8H   influenza vaccine adjuvanted  0.5 mL Intramuscular Tomorrow-1000   insulin aspart  0-5 Units Subcutaneous QHS   insulin aspart  0-6 Units Subcutaneous TID WC   lidocaine  1 patch Transdermal Daily   sevelamer carbonate  2,400 mg Oral TID WC   sodium chloride flush  3 mL Intravenous Q12H   Continuous Infusions:  sodium chloride     sodium chloride     sodium chloride     albumin human Stopped (09/05/20 1341)   azithromycin Stopped (09/05/20 1916)   cefTRIAXone (ROCEPHIN)  IV Stopped (09/05/20 1808)   PRN Meds: sodium chloride, sodium chloride, sodium chloride, acetaminophen, alteplase, hydroxypropyl methylcellulose / hypromellose, lidocaine (PF), lidocaine-prilocaine, menthol-cetylpyridinium, morphine injection, nitroGLYCERIN, ondansetron (ZOFRAN) IV, pentafluoroprop-tetrafluoroeth, sodium chloride flush, traMADol   Vital Signs    Vitals:   09/06/20 0100 09/06/20 0415 09/06/20 0553 09/06/20 0803  BP:  (!) 89/47 (!) 95/47 (!) 108/58  Pulse:  71 69 73  Resp: 10 17 14 16   Temp:  98.1 F (36.7 C)  98.1 F (36.7 C)  TempSrc:  Oral  Oral  SpO2:  100% 100% 95%  Weight:  76.7 kg    Height:        Intake/Output Summary (Last 24 hours) at 09/06/2020 0838 Last data filed at 09/05/2020 1500 Gross per 24 hour  Intake --  Output 1501 ml  Net -1501 ml   Filed Weights    09/04/20 1227 09/05/20 0248 09/06/20 0415  Weight: 66.9 kg 68.4 kg 76.7 kg    Physical Exam   GEN: Frail, in no acute distress.  HEENT: Grossly normal.  Neck: Supple, no JVD, carotid bruits, or masses. Cardiac: RRR, no murmurs, rubs, or gallops. No clubbing, cyanosis, edema. R groin cath site w/o bleeding/bruit/hematoma. Respiratory:  Respirations regular and unlabored, diminished breath sounds @ bilat bases w/ L basilar crackles. GI: Soft, nontender, nondistended, BS + x 4. MS: no deformity or atrophy. Skin: warm and dry, no rash. Neuro:  Strength and sensation are intact. Psych: AAOx3.  Normal affect.  Labs    Chemistry Recent Labs  Lab 09/02/20 1438 09/02/20 1438 09/04/20 0329 09/05/20 0640 09/06/20 0508  NA 143   < > 137 134* 138  K 5.1   < > 5.7* 6.1* 4.5  CL 99   < > 97* 93* 95*  CO2 30   < > 26 27 33*  GLUCOSE 168*   < > 162* 142* 78  BUN 51*   < > 55* 67* 35*  CREATININE 6.08*   < > 6.00* 6.76* 4.04*  CALCIUM 9.1   < > 8.8* 9.2 8.8*  PROT 6.3*  --  5.8*  --  6.0*  ALBUMIN 3.2*  --  3.0*  --  3.0*  AST 179*  --  74*  --  34  ALT 202*  --  138*  --  93*  ALKPHOS 206*  --  202*  --  193*  BILITOT 1.1  --  0.9  --  1.0  GFRNONAA 9*   < > 9* 8* 14*  GFRAA 10*  --  10*  --   --   ANIONGAP 14   < > 14 14 10    < > = values in this interval not displayed.     Hematology Recent Labs  Lab 09/02/20 1438 09/04/20 0329 09/06/20 0508  WBC 9.2 7.6 6.5  RBC 3.34* 3.28* 2.82*  HGB 10.7* 10.6* 9.0*  HCT 32.3* 31.2* 26.8*  MCV 96.7 95.1 95.0  MCH 32.0 32.3 31.9  MCHC 33.1 34.0 33.6  RDW 17.8* 17.5* 17.5*  PLT 134* 123* 105*    Cardiac Enzymes  Recent Labs  Lab 09/02/20 1438 09/02/20 1619  TROPONINIHS 602* 577*      BNP Recent Labs  Lab 09/02/20 1438  BNP >4,500.0*     Lipids  Lab Results  Component Value Date   CHOL 55 09/03/2020   HDL 37 (L) 09/03/2020   LDLCALC 15 09/03/2020   TRIG 17 09/03/2020   CHOLHDL 1.5 09/03/2020    HbA1c  Lab  Results  Component Value Date   HGBA1C 7.2 (H) 09/03/2020    Radiology    ---  Telemetry    RSR - Personally Reviewed  Cardiac Studies  2D echo 09/03/2020: 1. Left ventricular ejection fraction, by estimation, is 25 to 30%. The  left ventricle has severely decreased function. The left ventricle  demonstrates global hypokinesis. There is mild left ventricular  hypertrophy. Left ventricular diastolic parameters   are consistent with Grade II diastolic dysfunction (pseudonormalization).   2. Right ventricular systolic function is normal. The right ventricular  size is normal. There is severely elevated pulmonary artery systolic  pressure. The estimated right ventricular systolic pressure is 37.8 mmHg.   3. Left atrial size was moderately dilated.   4. Right atrial size was moderately dilated.   5. Moderate pleural effusion in the left lateral region.   6. The mitral valve is normal in structure. Mild mitral valve  regurgitation. No evidence of mitral stenosis. Moderate mitral annular  calcification.   7. Tricuspid valve regurgitation is moderate.   8. The aortic valve is normal in structure. Aortic valve regurgitation is  not visualized. Mild aortic valve sclerosis is present, with no evidence  of aortic valve stenosis.   9. Moderately dilated pulmonary artery.  10. The inferior vena cava is dilated in size with >50% respiratory  variability, suggesting right atrial pressure of 8 mmHg.  _____________   [facility] 09/04/2020: Conclusions: 1. Severe two-vessel coronary artery disease with chronic total occlusion of the proximal LAD and 90% stenosis of the proximal RCA. 2. Mild to moderate, nonobstructive coronary artery disease involving ramus intermedius and LCx. 3. Mildly elevated left heart filling pressures. 4. Severely elevated right heart filling pressures. 5. Moderate pulmonary hypertension. 6. Normal to mildly reduced cardiac output/index.  I suspect Fick cardiac output is  artifactually high secondary to AV fistula. 7. Successful PCI to proximal RCA using Resolute Onyx 3.0 x 22 mm drug-eluting stent with 0% residual stenosis and TIMI-3 flow.   Recommendations: 1. Dual antiplatelet therapy with aspirin and clopidogrel for at least 12 months. 2. Aggressive secondary prevention of coronary artery disease and addition of goal-directed medical therapy for severe systolic heart failure  due to ischemic cardiomyopathy, as heart rate and blood pressure tolerate. 3. Recommend medical therapy for chronic total occlusion of LAD.  If LVEF does not improve with medical therapy, viability study should be considered to determine if revascularization of the LAD territory may be beneficial. 4. Consider gentle escalation of fluid removal with hemodialysis. 5. Consider outpatient referral to the advanced heart failure team in [facility] for further management of biventricular heart failure and moderate pulmonary hypertension. _____________    Patient Profile     69 y.o. male with history of chronic hypoxic respiratory failure on supplemental oxygen via nasal cannula 2 L for an unknown duration secondary to COPD, type 2 diabetes, anemia of chronic disease, status post bilateral BKA secondary to what appears to be nonhealing ulcer/osteomyelitis with the right lower extremity being amputated in 2005 or 2006 and the left lower extremity being amputated "a while ago", ESRD on HD Monday, Wednesday, and Friday for uncertain duration who is followed by the [facility] health system who is being seen today for the evaluation of NSTEMI and acute HFrEF.   Assessment & Plan    1. NSTEMI/CAD:  S/p R/LHC 10/5 w/ severe 2 vessel CAD w/ CTO of the LAD and 90% stenosis of the prox RCA s/p PCI/DES.  Plan med Rx for CTO of the LAD.  If EF (currently 25-30%) does not improve and/or he has c/p, could consider outpt cardiac MRI to assess viability and determine potential benefit of LAD revasc.  Has had some c/p w/  coughing but otw c/p free.  Cont asa, statin,  blocker, plavix.  If he is d/c'd today, should f/u with his cardiologist @ the [place] in 1 wk.  2.  Acute HFrEF 2/2 ICM:  EF 25-30%.  Appears euvolemic on exam s/p HD yesterday.  Though he is in no distress, O2 sats drop to high 80's despite 3l O2 (chronic).  Diminished breath sounds @ bilat bases w/ L basilar crackles.  Desaturations out of proportion to volume status, and suspect COPD primary culprit.  Cont  blocker.  No acei/arb/arni/mra in the setting of ESRD and soft bps (also excludes bidil).  3.  ESRD:  S/p HD yesterday.  Nephrology following.  4.  Hyperkalemia:  4.5 this AM.  5.  COPD/chronic supplemental O2 dependence:  See #1. Desats to high 80's.  S/p abx this admission for concern re: pna.  Mgmt per IM.  6.  Essential HTN:  BP stable/soft on carvedilol.  Will have to watch carefully, esp on HD days.  7.  Anemia of chronic dzs/thrombocytopenia: Relatively stable.  8. DMII:  A1c 7.2. Insulin mgmt per IM.  Signed, Murray Hodgkins, NP  09/06/2020, 8:38 AM    For questions or updates, please contact   Please consult [email or web address] for contact info under Cardiology/STEMI.

## 2020-09-06 NOTE — Progress Notes (Signed)
[facility]  ROUNDING NOTE   Subjective:   Patient resting in bed, in no acute distress.He denies worsening SOB.However, nursing staff reports noticing SpO2 down to high 80's with ambulation. He reports chest pain associated with cough,but no pain at rest. No other complaints.   Objective:  Vital signs in last 24 hours:  Temp:  [97.7 F (36.5 C)-98.9 F (37.2 C)] 97.7 F (36.5 C) (10/07 1147) Pulse Rate:  [69-85] 73 (10/07 1147) Resp:  [10-19] 18 (10/07 1147) BP: (89-136)/(47-76) 110/61 (10/07 1147) SpO2:  [95 %-100 %] 97 % (10/07 1147) Weight:  [76.7 kg] 76.7 kg (10/07 0415)  Weight change: 9.758 kg Filed Weights   09/04/20 1227 09/05/20 0248 09/06/20 0415  Weight: 66.9 kg 68.4 kg 76.7 kg    Intake/Output: I/O last 3 completed shifts: In: -  Out: 1501 [Other:1501]   Intake/Output this shift:  No intake/output data recorded.  Physical Exam: General:  Resting in bed, pleasant and conversant  Head:  Normocephalic, atraumatic, moist oral mucosal membranes  Eyes: Anicteric  Neck: Supple, trachea at midline  Lungs:    lungs diminished at the bases, on O2 2 L via nasal cannula  Heart:  Regular, S1S2 no rubs or gallops  Abdomen:   Nondistended, nontender  Extremities:  Bilateral BKA, Trace edema on upper legs  Neurologic: Awake,alert,oriented x 3  Skin: No acute rashes or lesions  Access:  Right upper extremity AV fistula +bruit,+ thrill    Basic Metabolic Panel: Recent Labs  Lab 09/02/20 1438 09/02/20 1438 09/04/20 0329 09/05/20 0640 09/05/20 1100 09/06/20 0508  NA 143  --  137 134*  --  138  K 5.1  --  5.7* 6.1*  --  4.5  CL 99  --  97* 93*  --  95*  CO2 30  --  26 27  --  33*  GLUCOSE 168*  --  162* 142*  --  78  BUN 51*  --  55* 67*  --  35*  CREATININE 6.08*  --  6.00* 6.76*  --  4.04*  CALCIUM 9.1   < > 8.8* 9.2  --  8.8*  PHOS  --   --   --   --  7.2*  --    < > = values in this interval not displayed.    Liver Function Tests: Recent  Labs  Lab 09/02/20 1438 09/04/20 0329 09/06/20 0508  AST 179* 74* 34  ALT 202* 138* 93*  ALKPHOS 206* 202* 193*  BILITOT 1.1 0.9 1.0  PROT 6.3* 5.8* 6.0*  ALBUMIN 3.2* 3.0* 3.0*   Recent Labs  Lab 09/02/20 1438  LIPASE 37   No results for input(s): AMMONIA in the last 168 hours.  CBC: Recent Labs  Lab 09/02/20 1438 09/04/20 0329 09/06/20 0508  WBC 9.2 7.6 6.5  HGB 10.7* 10.6* 9.0*  HCT 32.3* 31.2* 26.8*  MCV 96.7 95.1 95.0  PLT 134* 123* 105*    Cardiac Enzymes: No results for input(s): CKTOTAL, CKMB, CKMBINDEX, TROPONINI in the last 168 hours.  BNP: Invalid input(s): POCBNP  CBG: Recent Labs  Lab 09/05/20 1643 09/05/20 2118 09/06/20 0805 09/06/20 0901 09/06/20 1146  GLUCAP 103* 144* 68* 134* 96    Microbiology: Results for orders placed or performed during the hospital encounter of 09/02/20  Respiratory Panel by RT PCR (Flu A&B, Covid) - Nasopharyngeal Swab     Status: None   Collection Time: 09/02/20  3:06 PM   Specimen: Nasopharyngeal Swab  Result Value  Ref Range Status   SARS Coronavirus 2 by RT PCR NEGATIVE NEGATIVE Final    Comment: (NOTE) SARS-CoV-2 target nucleic acids are NOT DETECTED.  The SARS-CoV-2 RNA is generally detectable in upper respiratoy specimens during the acute phase of infection. The lowest concentration of SARS-CoV-2 viral copies this assay can detect is 131 copies/mL. A negative result does not preclude SARS-Cov-2 infection and should not be used as the sole basis for treatment or other patient management decisions. A negative result may occur with  improper specimen collection/handling, submission of specimen other than nasopharyngeal swab, presence of viral mutation(s) within the areas targeted by this assay, and inadequate number of viral copies (<131 copies/mL). A negative result must be combined with clinical observations, patient history, and epidemiological information. The expected result is Negative.  Fact  Sheet for Patients:  PinkCheek.be  Fact Sheet for Healthcare Providers:  GravelBags.it  This test is no t yet approved or cleared by the [place] FDA and  has been authorized for detection and/or diagnosis of SARS-CoV-2 by FDA under an Emergency Use Authorization (EUA). This EUA will remain  in effect (meaning this test can be used) for the duration of the COVID-19 declaration under Section 564(b)(1) of the Act, 21 U.S.C. section 360bbb-3(b)(1), unless the authorization is terminated or revoked sooner.     Influenza A by PCR NEGATIVE NEGATIVE Final   Influenza B by PCR NEGATIVE NEGATIVE Final    Comment: (NOTE) The Xpert Xpress SARS-CoV-2/FLU/RSV assay is intended as an aid in  the diagnosis of influenza from Nasopharyngeal swab specimens and  should not be used as a sole basis for treatment. Nasal washings and  aspirates are unacceptable for Xpert Xpress SARS-CoV-2/FLU/RSV  testing.  Fact Sheet for Patients: PinkCheek.be  Fact Sheet for Healthcare Providers: GravelBags.it  This test is not yet approved or cleared by the [place] FDA and  has been authorized for detection and/or diagnosis of SARS-CoV-2 by  FDA under an Emergency Use Authorization (EUA). This EUA will remain  in effect (meaning this test can be used) for the duration of the  Covid-19 declaration under Section 564(b)(1) of the Act, 21  U.S.C. section 360bbb-3(b)(1), unless the authorization is  terminated or revoked. Performed at [facility], [address] [place], [place] [address]     Coagulation Studies: No results for input(s): LABPROT, INR in the last 72 hours.  Urinalysis: No results for input(s): COLORURINE, LABSPEC, PHURINE, GLUCOSEU, HGBUR, BILIRUBINUR, KETONESUR, PROTEINUR, UROBILINOGEN, NITRITE, LEUKOCYTESUR in the last 72 hours.  Invalid input(s):  APPERANCEUR    Imaging: CARDIAC CATHETERIZATION  Result Date: 09/04/2020 Conclusions: 1. Severe two-vessel coronary artery disease with chronic total occlusion of the proximal LAD and 90% stenosis of the proximal RCA. 2. Mild to moderate, nonobstructive coronary artery disease involving ramus intermedius and LCx. 3. Mildly elevated left heart filling pressures. 4. Severely elevated right heart filling pressures. 5. Moderate pulmonary hypertension. 6. Normal to mildly reduced cardiac output/index.  I suspect Fick cardiac output is artifactually high secondary to AV fistula. 7. Successful PCI to proximal RCA using Resolute Onyx 3.0 x 22 mm drug-eluting stent with 0% residual stenosis and TIMI-3 flow. Recommendations: 1. Dual antiplatelet therapy with aspirin and clopidogrel for at least 12 months. 2. Aggressive secondary prevention of coronary artery disease and addition of goal-directed medical therapy for severe systolic heart failure due to ischemic cardiomyopathy, as heart rate and blood pressure tolerate. 3. Recommend medical therapy for chronic total occlusion of LAD.  If LVEF does  not improve with medical therapy, viability study should be considered to determine if revascularization of the LAD territory may be beneficial. 4. Consider gentle escalation of fluid removal with hemodialysis. 5. Consider outpatient referral to the advanced heart failure team in [facility] for further management of biventricular heart failure and moderate pulmonary hypertension. Nelva Bush, MD [facility]     Medications:   . sodium chloride    . sodium chloride    . sodium chloride    . albumin human Stopped (09/05/20 1341)  . cefTRIAXone (ROCEPHIN)  IV Stopped (09/05/20 1808)   . aspirin EC  81 mg Oral Daily  . atorvastatin  5 mg Oral Daily  . azithromycin  500 mg Oral Daily  . carvedilol  3.125 mg Oral BID WC  . Chlorhexidine Gluconate Cloth  6 each Topical Q0600  . clopidogrel  75 mg Oral Q  breakfast  . ferrous sulfate  325 mg Oral BID WC  . gabapentin  300 mg Oral QPM  . heparin  5,000 Units Subcutaneous Q8H  . influenza vaccine adjuvanted  0.5 mL Intramuscular Tomorrow-1000  . insulin aspart  0-5 Units Subcutaneous QHS  . insulin aspart  0-6 Units Subcutaneous TID WC  . lidocaine  1 patch Transdermal Daily  . sevelamer carbonate  2,400 mg Oral TID WC  . sodium chloride flush  3 mL Intravenous Q12H   sodium chloride, sodium chloride, sodium chloride, acetaminophen, alteplase, hydroxypropyl methylcellulose / hypromellose, lidocaine (PF), lidocaine-prilocaine, menthol-cetylpyridinium, morphine injection, nitroGLYCERIN, ondansetron (ZOFRAN) IV, pentafluoroprop-tetrafluoroeth, sodium chloride flush, traMADol  Assessment/ Plan:  Frank Proctor is a 69 y.o.  male old gentleman well known to our practice. He cam[facility] ED today with SOB and chest pain.Patient was found to be fluid overloaded.   ESRD on dialysis MWF Right upper arm AV fistula Patient received dialysis yesterday Volume and electrolyte status acceptable  No additional dialysis required today Can continue outpatient dialysis MWF if getting discharged today  Chest pain/shortness of breath/ACS Patient underwent right and left heart catheterization on 09/04/2020, showed severe two-vessel CAD, placed PCI/DES to RCA. Cardiology following up  Hypoxic respiratory failure  Patient is continuing on his baseline home O2 requirement No acute respiratory distress noted  Hypertension Blood pressure readings within acceptable range      LOS: 4 Hyun Reali 10/7/202112:22 PM

## 2020-09-06 NOTE — Evaluation (Signed)
Physical Therapy Evaluation Patient Details Name: Frank Proctor MRN: [id number] DOB: 1951-10-15 Today's Date: 09/06/2020   History of Present Illness  Pt is a 69 year old male with history of COPD on home oxygen, diabetes, anemia of chronic disease, bilateral BKA, end-stage renal disease on hemodialysis presenting with chest pain to ED, diagnosed with NSTEMI and multifocal pneumonia. Had a drug-eluting stent placed to the RCA 10/5.    Clinical Impression  Pt alert, agreeable to PT, HOH. Initially did not report pain but with L shoulder movement and coughing pt reported his pain was 5/10 (L shoulder pain is chronic per pt). He stated that he has been at [facility] for rehab the last several months, and has been working on transferred to his [facility] and ambulating with PT with his prosthetic legs.   The patient exhibited some weakness in bilateral UEs, L more so than R due to chronic pain. He was able to lift bilateral extremities against gravity. Supine to sit with HOB elevated and use of grab bars and extended time. Fair sitting balance noted. spO2 checked intermittently, ranged from 86-91% on 3L, pt educated on PLB and rest breaks during mobility. Lateral scoot to recliner attempted, pt unable to do without assist, poor weight shift noted. The pt was unintentionally resistive to PT assist. Similar results noted with anterior/posterior scoots. Returned to supine with light minA and repositioned modA in bed.  Overall the patient demonstrated deficits (see "PT Problem List") that impede the patient's functional abilities, safety, and mobility and would benefit from skilled PT intervention. Recommendation is SNF to maximize pt independence, safety, and mobility.     Follow Up Recommendations SNF    Equipment Recommendations  Other (comment) (TBD at next venue of care)    Recommendations for Other Services       Precautions / Restrictions Precautions Precautions: Fall Precaution Comments:  bilat BKA Restrictions Weight Bearing Restrictions: No      Mobility  Bed Mobility Overal bed mobility: Needs Assistance Bed Mobility: Supine to Sit;Sit to Supine     Supine to sit: HOB elevated;Min guard Sit to supine: Min assist;HOB elevated   General bed mobility comments: light assist to return to supine for weight shift  Transfers Overall transfer level: Needs assistance   Transfers: Anterior-Posterior Transfer;Lateral/Scoot Transfers       Anterior-Posterior transfers: Mod assist  Lateral/Scoot Transfers: Max assist General transfer comment: Pt unintentionally resistive to PT assistance despite education. Able to scoot a small amount with supervision, but inadequate weight shift noted  Ambulation/Gait             General Gait Details: deferred  Stairs            Wheelchair Mobility    Modified Rankin (Stroke Patients Only)       Balance                                             Pertinent Vitals/Pain Pain Assessment: 0-10 Pain Score: 5  Pain Location: L shoulder pain, or chest with coughing Pain Descriptors / Indicators: Aching;Guarding;Grimacing Pain Intervention(s): Limited activity within patient's tolerance;Monitored during session;Repositioned    Home Living Family/patient expects to be discharged to:: Skilled nursing facility                 Additional Comments: Pt has been at [facility] for "several months"  Prior Function Level of Independence: Needs assistance   Gait / Transfers Assistance Needed: pt able to don bilateral prosthetic legs and has been practicing with PT to ambulate with RW           Hand Dominance   Dominant Hand: Left    Extremity/Trunk Assessment   Upper Extremity Assessment Upper Extremity Assessment: Generalized weakness;Defer to OT evaluation    Lower Extremity Assessment Lower Extremity Assessment: Generalized weakness (pt able to lift bilateral LEs against gravity,  bend bilateral knees, perform hip abduction/adduction)    Cervical / Trunk Assessment Cervical / Trunk Assessment: Normal  Communication   Communication: HOH (better hearing out of L ear)  Cognition Arousal/Alertness: Awake/alert Behavior During Therapy: WFL for tasks assessed/performed Overall Cognitive Status: Within Functional Limits for tasks assessed                                        General Comments      Exercises     Assessment/Plan    PT Assessment Patient needs continued PT services  PT Problem List Decreased strength;Decreased mobility;Decreased activity tolerance;Decreased balance       PT Treatment Interventions DME instruction;Therapeutic exercise;Wheelchair mobility training;Gait training;Balance training;Stair training;Neuromuscular re-education;Functional mobility training;Therapeutic activities;Patient/family education    PT Goals (Current goals can be found in the Care Plan section)  Acute Rehab PT Goals Patient Stated Goal: to get home after rehab PT Goal Formulation: With patient Time For Goal Achievement: 09/20/20 Potential to Achieve Goals: Good    Frequency Min 2X/week   Barriers to discharge        Co-evaluation               AM-PAC PT "6 Clicks" Mobility  Outcome Measure Help needed turning from your back to your side while in a flat bed without using bedrails?: A Little Help needed moving from lying on your back to sitting on the side of a flat bed without using bedrails?: A Lot Help needed moving to and from a bed to a chair (including a wheelchair)?: A Lot Help needed standing up from a chair using your arms (e.g., wheelchair or bedside chair)?: A Lot Help needed to walk in hospital room?: Total Help needed climbing 3-5 steps with a railing? : Total 6 Click Score: 11    End of Session Equipment Utilized During Treatment: Gait belt Activity Tolerance: Patient tolerated treatment well Patient left: in  bed;with call bell/phone within reach;with bed alarm set Nurse Communication: Mobility status PT Visit Diagnosis: Other abnormalities of gait and mobility (R26.89);Muscle weakness (generalized) (M62.81);Difficulty in walking, not elsewhere classified (R26.2)    Time: 0998-3382 PT Time Calculation (min) (ACUTE ONLY): 39 min   Charges:   PT Evaluation $PT Eval Moderate Complexity: 1 Mod PT Treatments $Therapeutic Activity: 23-37 mins       Lieutenant Diego PT, DPT 10:23 AM,09/06/20

## 2020-09-06 NOTE — Discharge Summary (Signed)
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 Authorization (EUA). This EUA will remain  in effect (meaning this test can be used) for the duration of the  Covid-19 declaration under Section 564(b)(1) of the Act, 21  U.S.C. section 360bbb-3(b)(1), unless the authorization is  terminated or revoked. Performed at [facility], [address], [place], [place] [address]      Time coordinating discharge: Over 30 minutes  SIGNED:   Wyvonnia Dusky, MD  [facility] 09/06/2020, 1:39 PM Pager   If 7PM-7AM, please contact night-coverage [email or web address]

## 2020-09-06 NOTE — Plan of Care (Signed)
Patient discharged at this time via stretcher accompanied by EMS services to return to [facility] facility with all belongings. All VSS upon discharging. Report called and given to nurse at facility. Patient understood and verbalized understanding of discharge instructions. Wife aware of plan of care. Flu shot administered prior to leaving hospital, verification in discharge instructions. PIV/Tele removed by Probation officer.   Problem: Education: Goal: Knowledge of General Education information will improve Description: Including pain rating scale, medication(s)/side effects and non-pharmacologic comfort measures Outcome: Completed/Met   Problem: Health Behavior/Discharge Planning: Goal: Ability to manage health-related needs will improve Outcome: Completed/Met   Problem: Clinical Measurements: Goal: Ability to maintain clinical measurements within normal limits will improve Outcome: Completed/Met Goal: Will remain free from infection Outcome: Completed/Met Goal: Diagnostic test results will improve Outcome: Completed/Met Goal: Respiratory complications will improve Outcome: Completed/Met Goal: Cardiovascular complication will be avoided Outcome: Completed/Met   Problem: Activity: Goal: Risk for activity intolerance will decrease Outcome: Completed/Met   Problem: Nutrition: Goal: Adequate nutrition will be maintained Outcome: Completed/Met   Problem: Coping: Goal: Level of anxiety will decrease Outcome: Completed/Met   Problem: Elimination: Goal: Will not experience complications related to bowel motility Outcome: Completed/Met Goal: Will not experience complications related to urinary retention Outcome: Completed/Met   Problem: Pain Managment: Goal: General experience of comfort will improve Outcome: Completed/Met   Problem: Safety: Goal: Ability to remain free from injury will improve Outcome: Completed/Met   Problem: Skin Integrity: Goal: Risk for impaired skin integrity  will decrease Outcome: Completed/Met   Problem: Education: Goal: Understanding of CV disease, CV risk reduction, and recovery process will improve Outcome: Completed/Met Goal: Individualized Educational Video(s) Outcome: Completed/Met   Problem: Activity: Goal: Ability to return to baseline activity level will improve Outcome: Completed/Met   Problem: Cardiovascular: Goal: Ability to achieve and maintain adequate cardiovascular perfusion will improve Outcome: Completed/Met Goal: Vascular access site(s) Level 0-1 will be maintained Outcome: Completed/Met   Problem: Health Behavior/Discharge Planning: Goal: Ability to safely manage health-related needs after discharge will improve Outcome: Completed/Met

## 2021-01-01 DEATH — deceased

## 2021-04-26 IMAGING — CR DG LUMBAR SPINE 2-3V
2 series · 3 of 3 positions shown · non-contrast
Comparison: None.

CLINICAL DATA: Low back pain after fall.

EXAM:
LUMBAR SPINE - 2-3 VIEW

[dg lumbar spine 2-3 views (1 of 2)]
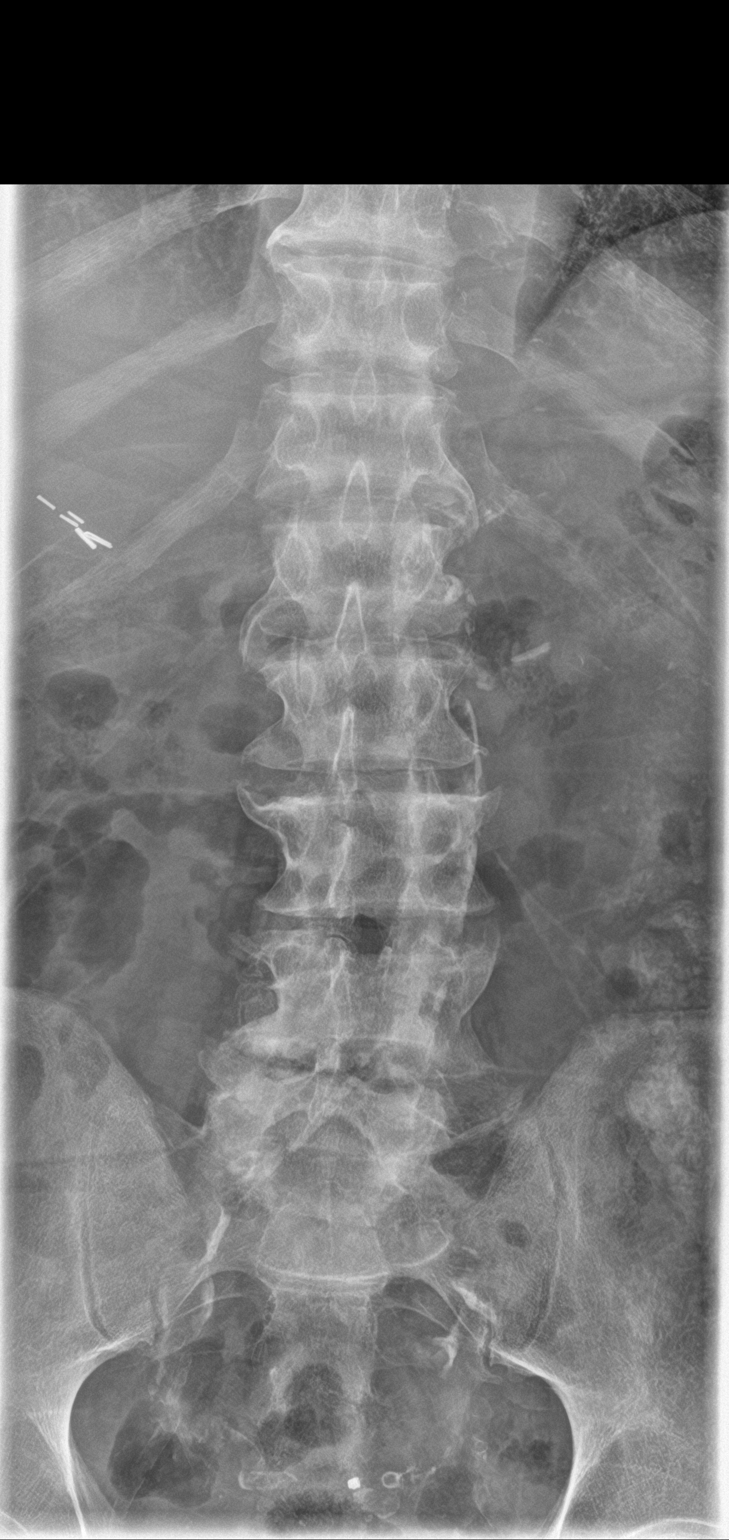

[Series 1: dg lumbar spine 2-3 views · 0.14mm/px · 2 of 2 slices shown (2 of 2)]
[im 1/2]
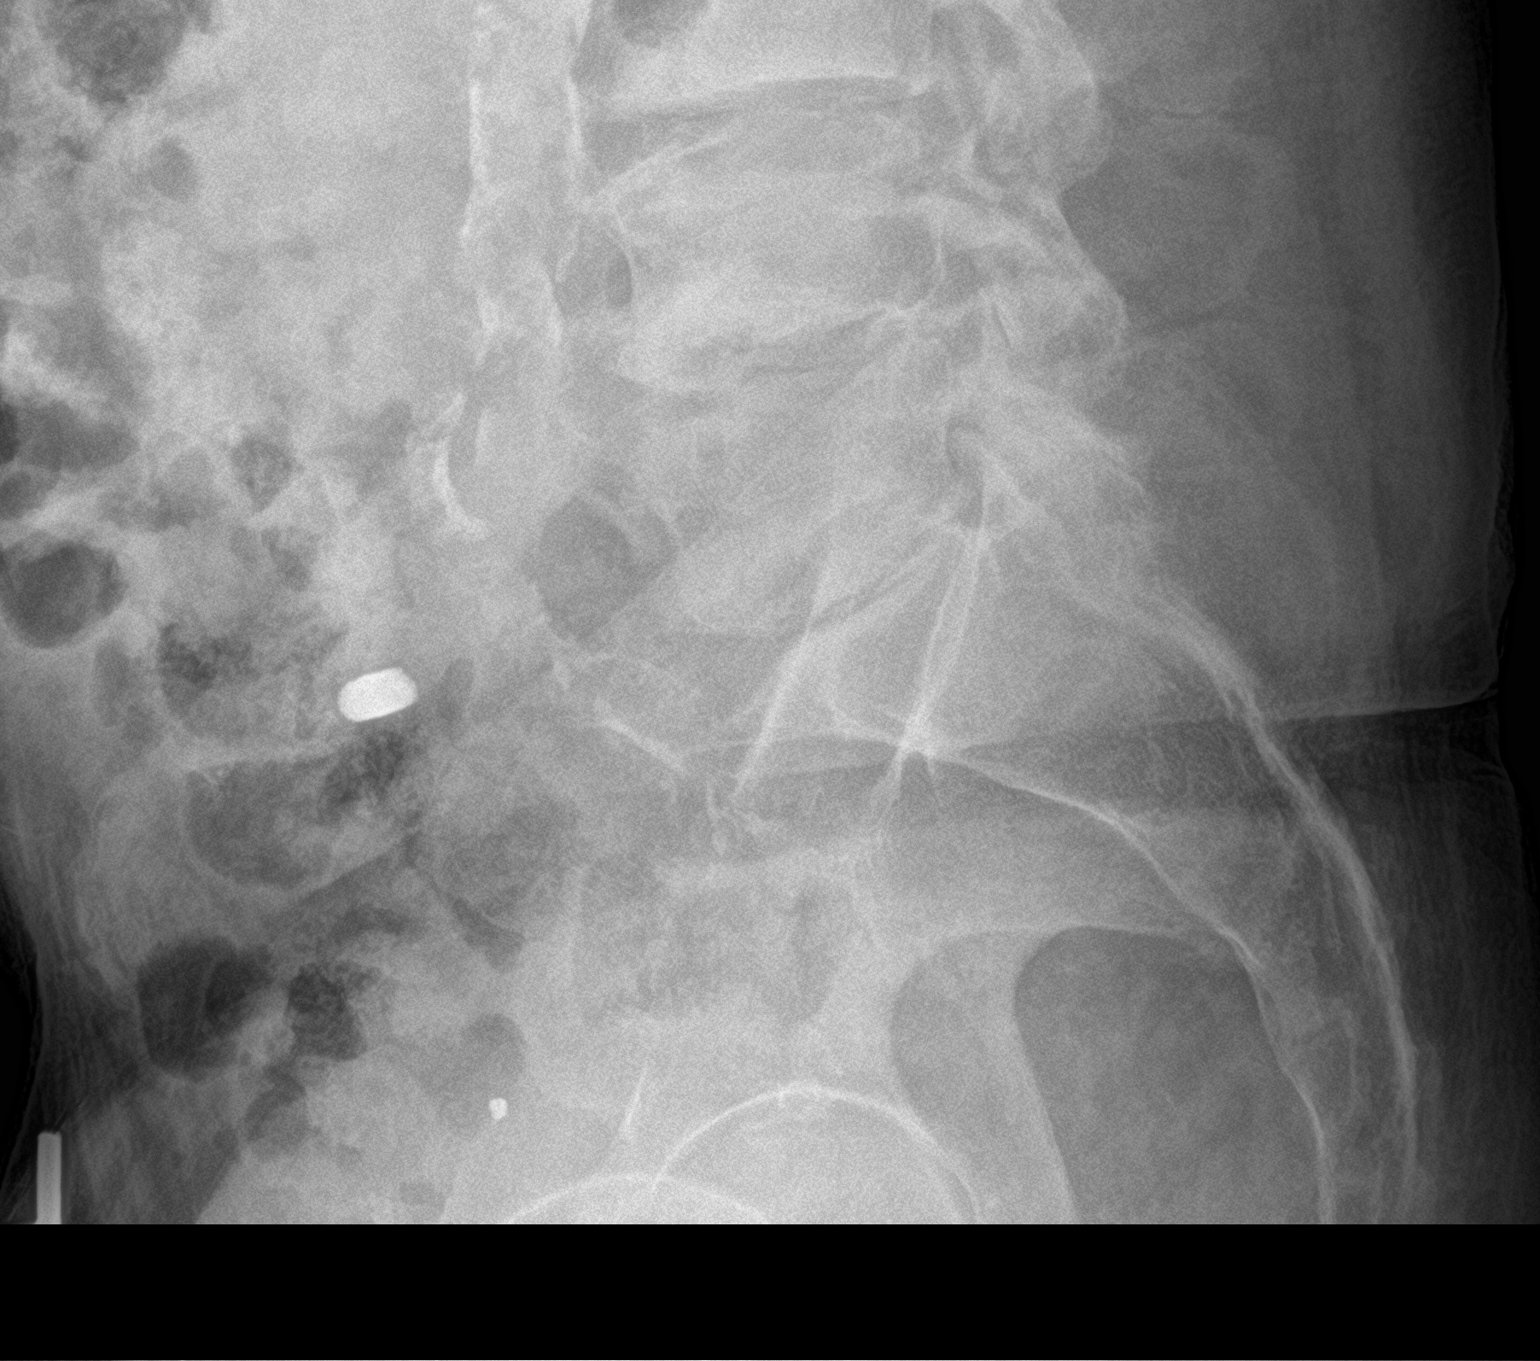
[im 2/2]
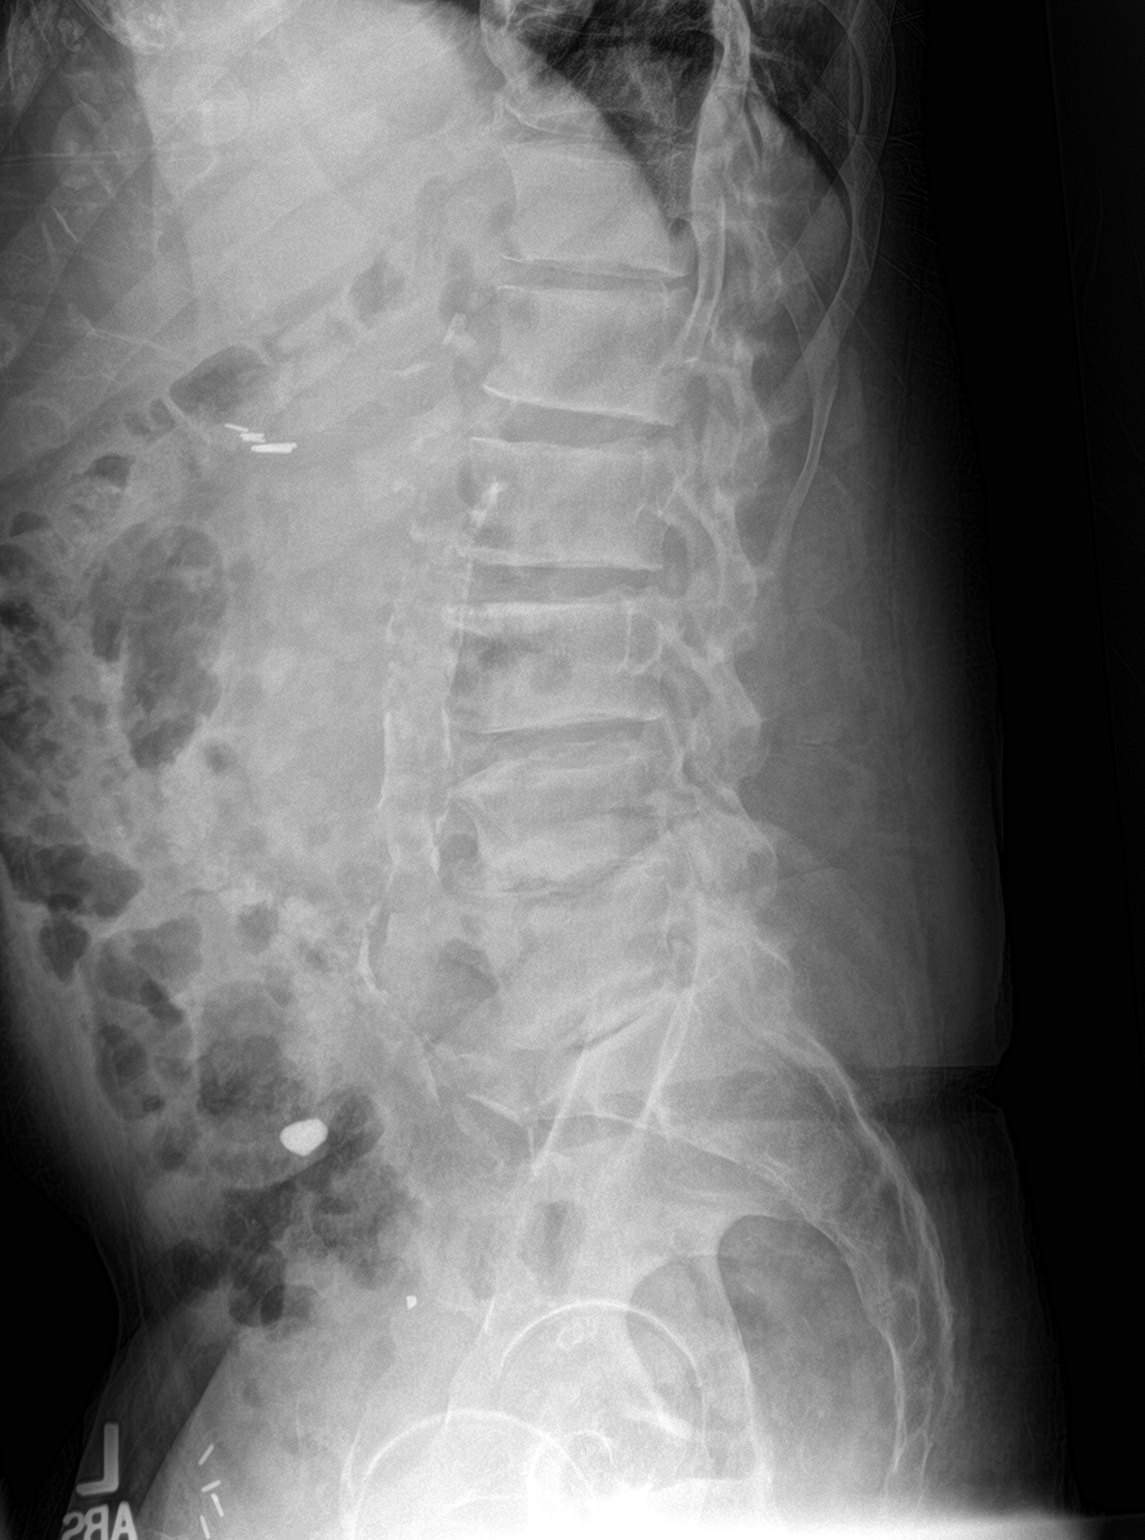

[3 of 3 positions shown; findings below may reference images not displayed]

FINDINGS: Subtle curvature of the lumbar spine convex left. Vertebral body
heights are maintained. There is mild to moderate spondylosis of the
lumbar spine to include facet arthropathy. Mild disc space narrowing
at the L3-4 L4-5 levels and to lesser extent at the L5-S1 level. No
evidence of compression fracture or spondylolisthesis. Calcified
plaque over the abdominal
IMPRESSION: No acute findings.

Mild to moderate spondylosis of the lumbar spine with disc disease
from the L3-4 level to the L5-S1 level.

## 2021-12-17 IMAGING — DX DG CHEST 1V PORT
1 series · 1 of 1 positions shown · non-contrast
Comparison: 01/11/2020

CLINICAL DATA: Patient removed his oxygen. Reportedly recent
pneumonia.

EXAM:
PORTABLE CHEST 1 VIEW

[chest ap]
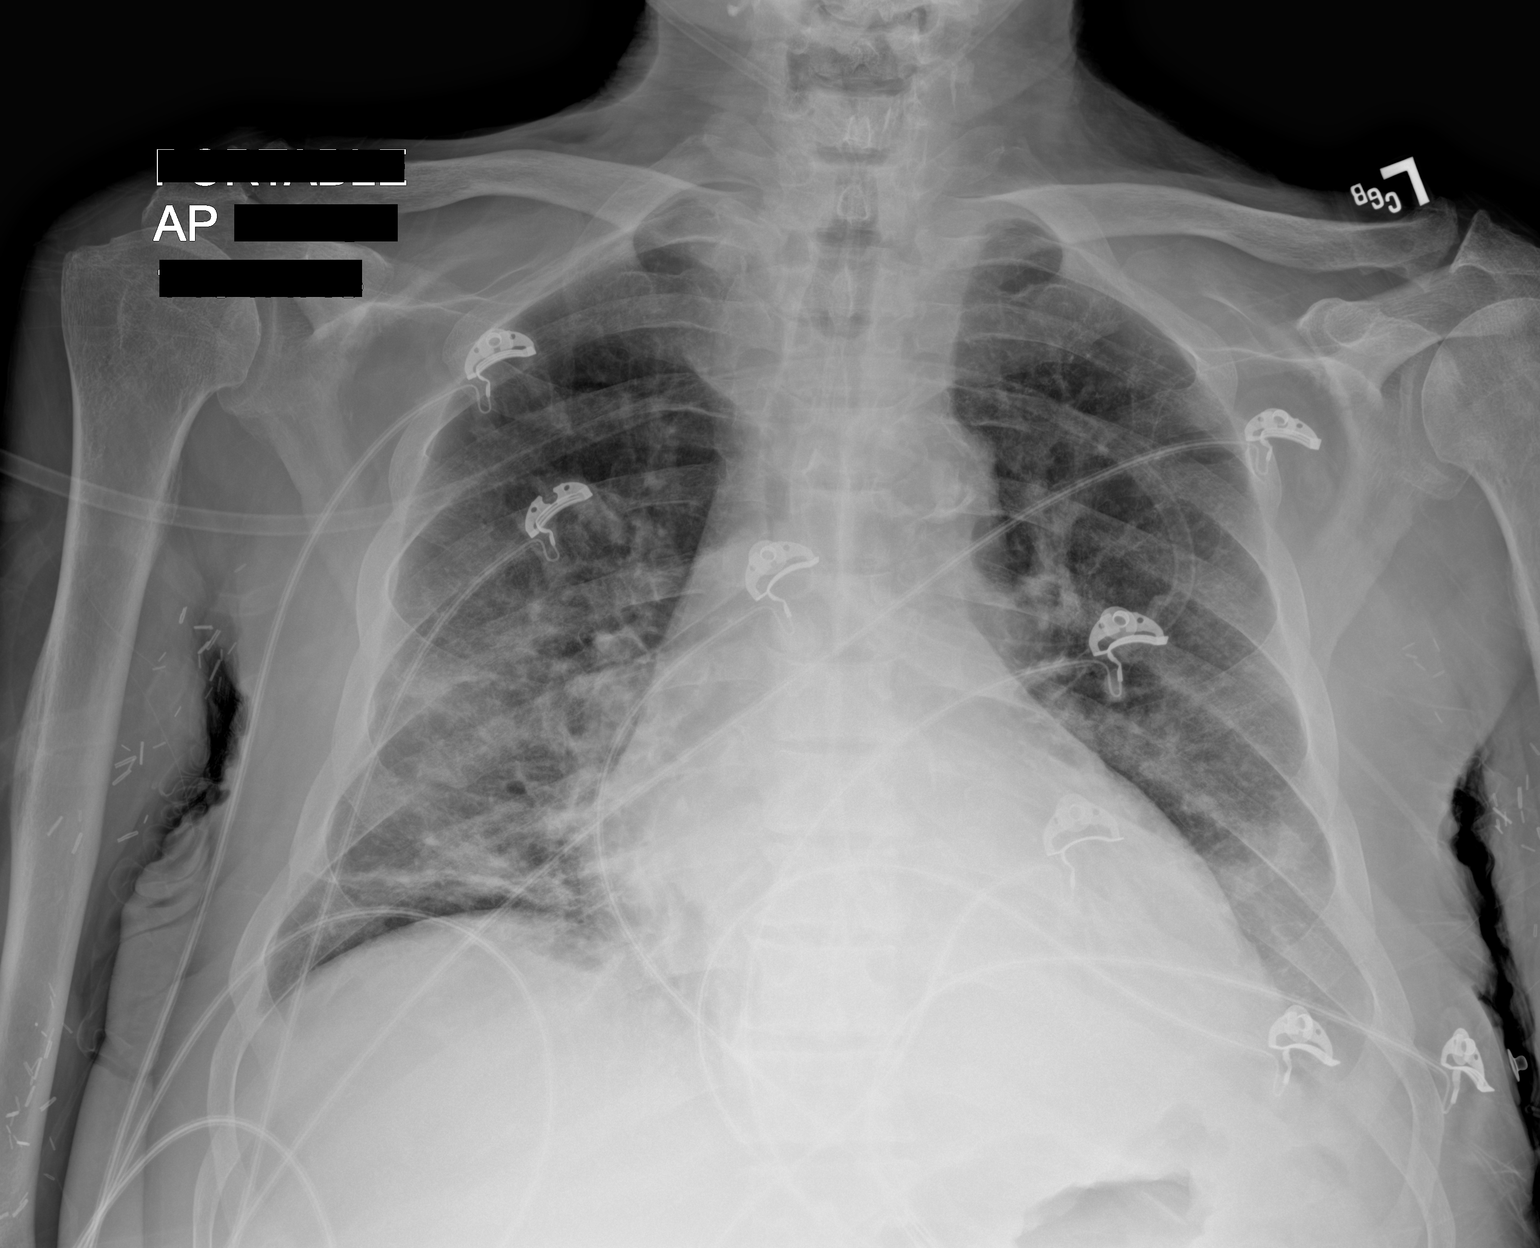

[1 of 1 positions shown; findings below may reference images not displayed]

FINDINGS: Patchy bilateral airspace opacities with dense consolidation at the
left lung base. Possible layering small left pleural effusion. No
discernible pneumothorax. Mild enlargement the cardiac silhouette.
Aortic atherosclerosis. Multiple clips projecting over the bilateral
arms and axilla.
IMPRESSION: 1. Patchy bilateral airspace opacities with dense consolidation at
the left lung base. Findings are concerning for multifocal
pneumonia. Recommend follow-up to resolution.
2. Possible layering small left pleural effusion.
3. Mild cardiomegaly.
